# Patient Record
Sex: Female | Born: 1945
Health system: Southern US, Community
[De-identification: ages and names within clinical notes are randomized; demographics above are authoritative.]

## PROBLEM LIST (undated history)

## (undated) DIAGNOSIS — Z720 Tobacco use: Principal | ICD-10-CM

## (undated) DIAGNOSIS — B019 Varicella without complication: Secondary | ICD-10-CM

## (undated) DIAGNOSIS — E785 Hyperlipidemia, unspecified: Secondary | ICD-10-CM

## (undated) DIAGNOSIS — I10 Essential (primary) hypertension: Secondary | ICD-10-CM

## (undated) DIAGNOSIS — J40 Bronchitis, not specified as acute or chronic: Secondary | ICD-10-CM

## (undated) DIAGNOSIS — T7840XA Allergy, unspecified, initial encounter: Secondary | ICD-10-CM

## (undated) DIAGNOSIS — Z0000 Encounter for general adult medical examination without abnormal findings: Secondary | ICD-10-CM

## (undated) HISTORY — DX: Hyperlipidemia, unspecified: E78.5

## (undated) HISTORY — DX: Tobacco use: Z72.0

## (undated) HISTORY — DX: Allergy, unspecified, initial encounter: T78.40XA

## (undated) HISTORY — DX: Essential (primary) hypertension: I10

## (undated) HISTORY — PX: TONSILLECTOMY AND ADENOIDECTOMY: SHX28

## (undated) HISTORY — DX: Encounter for general adult medical examination without abnormal findings: Z00.00

## (undated) HISTORY — DX: Varicella without complication: B01.9

## (undated) HISTORY — DX: Bronchitis, not specified as acute or chronic: J40

## (undated) HISTORY — PX: TUBAL LIGATION: SHX77

---
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
## 1988-12-18 HISTORY — PX: OTHER SURGICAL HISTORY: SHX169

## 1995-04-20 HISTORY — PX: BREAST SURGERY: SHX581

## 2004-11-03 ENCOUNTER — Other Ambulatory Visit: Admission: RE | Admit: 2004-11-03 | Discharge: 2004-11-03 | Payer: Self-pay | Admitting: Family Medicine

## 2005-11-29 ENCOUNTER — Other Ambulatory Visit: Admission: RE | Admit: 2005-11-29 | Discharge: 2005-11-29 | Payer: Self-pay | Admitting: Family Medicine

## 2006-12-05 ENCOUNTER — Other Ambulatory Visit: Admission: RE | Admit: 2006-12-05 | Discharge: 2006-12-05 | Payer: Self-pay | Admitting: Family Medicine

## 2008-04-17 LAB — CONVERTED CEMR LAB

## 2008-04-19 HISTORY — PX: OTHER SURGICAL HISTORY: SHX169

## 2008-11-17 LAB — CONVERTED CEMR LAB

## 2008-12-06 LAB — HM MAMMOGRAPHY

## 2008-12-11 ENCOUNTER — Other Ambulatory Visit: Admission: RE | Admit: 2008-12-11 | Discharge: 2008-12-11 | Payer: Self-pay | Admitting: Family Medicine

## 2009-01-14 ENCOUNTER — Encounter: Payer: Self-pay | Admitting: Family Medicine

## 2009-12-10 ENCOUNTER — Ambulatory Visit: Payer: Self-pay | Admitting: Family Medicine

## 2009-12-10 DIAGNOSIS — Q054 Unspecified spina bifida with hydrocephalus: Secondary | ICD-10-CM | POA: Insufficient documentation

## 2009-12-10 DIAGNOSIS — M25569 Pain in unspecified knee: Secondary | ICD-10-CM | POA: Insufficient documentation

## 2009-12-10 DIAGNOSIS — N189 Chronic kidney disease, unspecified: Secondary | ICD-10-CM

## 2009-12-10 DIAGNOSIS — E663 Overweight: Secondary | ICD-10-CM | POA: Insufficient documentation

## 2009-12-10 DIAGNOSIS — D649 Anemia, unspecified: Secondary | ICD-10-CM

## 2009-12-10 DIAGNOSIS — M542 Cervicalgia: Secondary | ICD-10-CM

## 2009-12-10 DIAGNOSIS — E782 Mixed hyperlipidemia: Secondary | ICD-10-CM | POA: Insufficient documentation

## 2009-12-10 DIAGNOSIS — I10 Essential (primary) hypertension: Secondary | ICD-10-CM

## 2009-12-10 DIAGNOSIS — B079 Viral wart, unspecified: Secondary | ICD-10-CM | POA: Insufficient documentation

## 2009-12-10 LAB — CONVERTED CEMR LAB
ALT: 19 units/L (ref 0–35)
AST: 21 units/L (ref 0–37)
Albumin: 4.2 g/dL (ref 3.5–5.2)
Alkaline Phosphatase: 74 units/L (ref 39–117)
BUN: 27 mg/dL — ABNORMAL HIGH (ref 6–23)
Bilirubin, Direct: 0.1 mg/dL (ref 0.0–0.3)
Cholesterol: 196 mg/dL (ref 0–200)
Creatinine, Ser: 1.2 mg/dL (ref 0.4–1.2)
GFR calc non Af Amer: 47.35 mL/min (ref >60)
Total Protein: 6.5 g/dL (ref 6.0–8.3)
Triglycerides: 93 mg/dL (ref 0.0–149.0)

## 2010-01-05 ENCOUNTER — Ambulatory Visit: Payer: Self-pay | Admitting: Family Medicine

## 2010-01-05 LAB — CONVERTED CEMR LAB
Basophils Absolute: 0 10*3/uL (ref 0.0–0.1)
Basophils Relative: 0.4 % (ref 0.0–3.0)
Eosinophils Absolute: 0.1 10*3/uL (ref 0.0–0.7)
MCHC: 34.4 g/dL (ref 30.0–36.0)
MCV: 87.9 fL (ref 78.0–100.0)
Monocytes Absolute: 0.3 10*3/uL (ref 0.1–1.0)
Neutrophils Relative %: 73.6 % (ref 43.0–77.0)
Platelets: 221 10*3/uL (ref 150.0–400.0)
RDW: 13 % (ref 11.5–14.6)

## 2010-01-12 ENCOUNTER — Other Ambulatory Visit: Admission: RE | Admit: 2010-01-12 | Discharge: 2010-01-12 | Payer: Self-pay | Admitting: Family Medicine

## 2010-01-12 ENCOUNTER — Ambulatory Visit: Payer: Self-pay | Admitting: Family Medicine

## 2010-01-12 LAB — HM PAP SMEAR

## 2010-01-14 ENCOUNTER — Encounter: Payer: Self-pay | Admitting: Family Medicine

## 2010-02-04 ENCOUNTER — Encounter: Payer: Self-pay | Admitting: Family Medicine

## 2010-05-13 ENCOUNTER — Telehealth: Payer: Self-pay | Admitting: Family Medicine

## 2010-05-19 NOTE — Letter (Signed)
Summary: Generic Letter  [facility]   [place], [place] [address]   Phone: [phone]  Fax: [phone]    01/05/2010  [address] [place], [place]  [address]  Dear Ms. Kroft,    Our office wanted to let you know that everything was normal with your labs. I have also included a copy of the labs for your records.  Any questions please don't hesitate to contact us at [phone].       Sincerely,   Gardenia Phlegm RMA

## 2010-05-19 NOTE — Letter (Signed)
Summary: Records from [facility]  Records from [facility] 2008-2010   Imported By: Laural Benes 12/19/2009 11:03:37  _____________________________________________________________________  External Attachment:    Type:   Image     Comment:   External Document

## 2010-05-19 NOTE — Letter (Signed)
Summary: Generic Letter  [facility]   [place], [place] [address]   Phone: [phone]  Fax: [phone]     01/14/2010  [place], [place]  [address]   Dear Ms. Harnack,   Our office wanted to inform you that your pap came back normal.   We don't have a number listed for you so if you could contact us with a phone number to update your chart it would be greatly appreciated.  If you have any questions please contact our office at [phone].          Sincerely,   Gardenia Phlegm RMA
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
## 2010-05-19 NOTE — Assessment & Plan Note (Signed)
Summary: follow up with pap/cb   Vital Signs:  Patient profile:   65 year old female Height:      60.5 inches (153.67 cm) Weight:      175 pounds (79.55 kg) O2 Sat:      98 % on Room air Temp:     97.6 degrees F (36.44 degrees C) oral Pulse rate:   96 / minute BP sitting:   112 / 84  (left arm) Cuff size:   regular  Vitals Entered By: Gardenia Phlegm RMA (January 12, 2010 9:21 AM)  O2 Flow:  Room air  Serial Vital Signs/Assessments:  Time      Position  BP       Pulse  Resp  Temp     By                     128/90                         Penni Homans MD  CC: follow-up visit w/ pap/ CF Is Patient Diabetic? No Menarche (age onset years): 11   Menses interval (days): every 21 to 35 days Menstrual flow (days): 8 days Last PAP Result historical   History of Present Illness: Patient in today for annual pap, generally doing well. Is due for her annual MGM and obtains them at [facility]. No breast or GYN c/o today. No discharge/lesions/pain/f/c/abd or back discomfort. No dysuria/CP/palp/SOB/GI concerns. She is noting an irritated bump in her right nares over the past week. No nasal congestion or epistaxis  Current Medications (verified): 1)  Voltaren 1 % Gel (Diclofenac Sodium) .... As Needed 2)  Tramadol Hcl 50 Mg Tabs (Tramadol Hcl) .... Take 1-2 Tab As Needed For Pain. Maximum of 8 Tabs Per Day 3)  Maxzide-25 37.5-25 Mg Tabs (Triamterene-Hctz) .Marland Kitchen.. 1 Tab Each Morning  Allergies (verified): No Known Drug Allergies  Past History:  Past medical history reviewed for relevance to current acute and chronic problems. Social history (including risk factors) reviewed for relevance to current acute and chronic problems.  Social History: Reviewed history from 12/10/2009 and no changes required. Occupation: Pharmacist, hospital at [employer] Current Smoker: max of a 1/4 ppd Married Alcohol use-yes, occasional Drug use-no Regular exercise-yes, intermittently Avoids red meat uses  seat belts  Review of Systems      See HPI  Physical Exam  General:  Well-developed,well-nourished,in no acute distress; alert,appropriate and cooperative throughout examination Head:  Normocephalic and atraumatic without obvious abnormalities. No apparent alopecia or balding. Ears:  External ear exam shows no significant lesions or deformities.  Otoscopic examination reveals clear canals, tympanic membranes are intact bilaterally without bulging, retraction, inflammation or discharge. Hearing is grossly normal bilaterally. Nose:  External nasal examination shows no deformity or inflammation. Nasal mucosa are pink and moist  exudates. Small white head noted in right nares on septum Mouth:  Oral mucosa and oropharynx without lesions or exudates.  Teeth in good repair. Neck:  No deformities, masses, or tenderness noted. Chest Wall:  No deformities, masses, or tenderness noted. Breasts:  No mass, nodules, thickening, tenderness, bulging, retraction, inflamation, nipple discharge or skin changes noted.   Lungs:  Normal respiratory effort, chest expands symmetrically. Lungs are clear to auscultation, no crackles or wheezes. Heart:  Normal rate and regular rhythm. S1 and S2 normal without gallop, murmur, click, rub or other extra sounds. Abdomen:  Bowel sounds positive,abdomen soft and non-tender without masses, organomegaly or hernias  noted. Genitalia:  Normal introitus for age, no external lesions, thin/white vaginal discharge, mucosa pink and moist, no vaginal or cervical lesions, no vaginal atrophy, no friaility or hemorrhage, normal uterus size and position, no adnexal masses or tenderness Msk:  No deformity or scoliosis noted of thoracic or lumbar spine.   Extremities:  No clubbing, cyanosis, edema, or deformity noted  Neurologic:  No cranial nerve deficits noted. Station and gait are normal. Plantar reflexes are down-going bilaterally. DTRs are symmetrical throughout. Sensory, motor and  coordinative functions appear intact. Cervical Nodes:  No lymphadenopathy noted Psych:  Cognition and judgment appear intact. Alert and cooperative with normal attention span and concentration. No apparent delusions, illusions, hallucinations   Impression & Recommendations:  Problem # 1:  Preventive Health Care (ICD-V70.0) Pap taken today and MGM ordered, reviewed report on last colonosocpy done in July of 2009 with patient, this showed some Adenometous polyps and they recommended repeat colonoscopy in 3 years which would be July of 2012, patient aware. Presently denies any bowel concerns. No bloody or tarry stool, bowels move most days without difficulty  Problem # 2:  ANEMIA (ICD-285.9) Resolved, cont vit B complex  Problem # 3:  MIXED HYPERLIPIDEMIA (ICD-272.2)  The following medications were removed from the medication list:    Simvastatin 80 Mg Tabs (Simvastatin) .Marland Kitchen... 1 tablet by mouth in evening Her updated medication list for this problem includes:    Simvastatin 40 Mg Tabs (Simvastatin) .Marland Kitchen... 1 tab by mouth at bedtime Encouraged 2 fish oil caps daily  Problem # 4:  ESSENTIAL HYPERTENSION, BENIGN (ICD-401.1)  Her updated medication list for this problem includes:    Maxzide-25 37.5-25 Mg Tabs (Triamterene-hctz) .Marland Kitchen... 1 tab each morning Well controlled no changes today  Complete Medication List: 1)  Voltaren 1 % Gel (Diclofenac sodium) .... As needed 2)  Tramadol Hcl 50 Mg Tabs (Tramadol hcl) .... Take 1-2 tab as needed for pain. maximum of 8 tabs per day 3)  Maxzide-25 37.5-25 Mg Tabs (Triamterene-hctz) .Marland Kitchen.. 1 tab each morning 4)  Simvastatin 40 Mg Tabs (Simvastatin) .Marland Kitchen.. 1 tab by mouth at bedtime 5)  Bactroban 2 % Oint (Mupirocin) .... Apply to right nares two times a day x 7days and as needed lesion two times a day after that  Other Orders: Tdap => 58yrs IM [id number]) Admin 1st Vaccine [id number])  Patient Instructions: 1)  Please schedule a follow-up appointment in 1 year.    2)  all labs prior to annual exam, v70.0 3)  BMP prior to visit, ICD-9:  4)  Hepatic Panel prior to visit ICD-9:  5)  Lipid panel prior to visit ICD-9 :  6)  TSH prior to visit ICD-9 :  7)  CBC w/ Diff prior to visit ICD-9 :  8)  in 3 months needs FLP, renal, lft for 272.4 and 401.1 9)  Avoid trans fats 10)  Take 2 fish or 2 flaxseed oil caps daily, ca/vit d daily and increase exercise Prescriptions: BACTROBAN 2 % OINT (MUPIROCIN) apply to right nares two times a day x 7days and as needed lesion two times a day after that  #1 tube x 1   Entered and Authorized by:   Penni Homans MD   Signed by:   Penni Homans MD on 01/12/2010   Method used:   Electronically to        [facility]  [phone]* (retail)       [address]  [place], [place]  [address]       Ph: [phone] or [phone]       Fax: [phone]   RxID:   [id number] SIMVASTATIN 40 MG TABS (SIMVASTATIN) 1 tab by mouth at bedtime  #30 x 5   Entered and Authorized by:   Penni Homans MD   Signed by:   Penni Homans MD on 01/12/2010   Method used:   Electronically to        [facility]  [phone]* (retail)       [address]       [place], [place]  [address]       Ph: [phone] or [phone]       Fax: [phone]   RxID:   [id number] VOLTAREN 1 % GEL (DICLOFENAC SODIUM) as needed  #1 tube x 0   Entered by:   Gardenia Phlegm RMA   Authorized by:   Penni Homans MD   Signed by:   Gardenia Phlegm RMA on 01/12/2010   Method used:   Electronically to        [facility]  [phone]* (retail)       [address]       [place],   [address]       Ph: [phone] or [phone]       Fax: [phone]   [id number]     Immunizations Administered:  Tetanus Vaccine:    Vaccine Type: Tdap    Site: left deltoid    Mfr: GlaxoSmithKline    Dose: 0.5 ml    Route: IM    Given by:  Gardenia Phlegm RMA    Exp. Date: 02/06/2012    Lot #: [id number]    VIS given: 03/06/08 version given January 12, 2010.

## 2010-05-21 NOTE — Progress Notes (Signed)
Summary: Maxzide Refill  Phone Note Refill Request Call back at [phone](work) Message from:  Patient on May 13, 2010 11:17 AM  Refills Requested: Medication #1:  [id number] 37.5-25 MG TABS 1 tab each morning   Dosage confirmed as above?Dosage Confirmed   Brand Name Necessary? No   Supply Requested: 6 months Pt states needs refill, cannot schedule OV til her insurance is active which will be the end of Feb 2012   Method Requested: Electronic Next Appointment Scheduled: none Initial call taken by: Titus Dubin,  May 13, 2010 11:18 AM    Prescriptions: Bradd Burner 37.5-25 MG TABS (TRIAMTERENE-HCTZ) 1 tab each morning  #30 x 2   Entered by:   Gardenia Phlegm RMA   Authorized by:   Penni Homans MD   Signed by:   Gardenia Phlegm RMA on 05/13/2010   Method used:   Electronically to        [facility]  [phone]* (retail)       [address]       [place], [place]  [address]       Ph: [phone] or [phone]       Fax: [phone]   RxID:   [id number]

## 2010-07-31 ENCOUNTER — Encounter: Payer: Self-pay | Admitting: Family Medicine

## 2010-08-05 ENCOUNTER — Ambulatory Visit: Payer: Self-pay | Admitting: Family Medicine

## 2010-08-12 ENCOUNTER — Other Ambulatory Visit: Payer: Self-pay | Admitting: Family Medicine

## 2010-08-12 MED ORDER — TRIAMTERENE-HCTZ 37.5-25 MG PO TABS: 1.0000 | ORAL_TABLET | Freq: Every day | ORAL | Status: DC

## 2010-08-12 MED ORDER — LOVASTATIN 40 MG PO TABS: 40.0000 mg | ORAL_TABLET | Freq: Every day | ORAL | Status: DC

## 2010-08-12 NOTE — Telephone Encounter (Signed)
Patient needs medication refill but cannot afford to schedule an OV, please contact patient

## 2010-08-12 NOTE — Telephone Encounter (Signed)
Per pt she would like her hctz called in to [facility] on [address] and something different than Simvastatin because it makes her "bones ache".  Per md can have #30 X 2 of the hctz and Lovastatin 40 mg. Pt must make a lab appt and appt with MD in 3 months or no other meds will be dispersed. Pt states she will make a lab and MD appt in 3 months.

## 2010-11-20 ENCOUNTER — Telehealth: Payer: Self-pay | Admitting: Family Medicine

## 2010-11-20 NOTE — Telephone Encounter (Signed)
Patient needs bp med refill but cannot afford an OV until Sept, please contact patient

## 2010-11-23 MED ORDER — TRIAMTERENE-HCTZ 37.5-25 MG PO TABS: 1.0000 | ORAL_TABLET | Freq: Every day | ORAL | Status: DC

## 2011-01-14 ENCOUNTER — Ambulatory Visit (INDEPENDENT_AMBULATORY_CARE_PROVIDER_SITE_OTHER): Payer: PRIVATE HEALTH INSURANCE | Admitting: Family Medicine

## 2011-01-14 ENCOUNTER — Encounter: Payer: Self-pay | Admitting: Family Medicine

## 2011-01-14 DIAGNOSIS — B019 Varicella without complication: Secondary | ICD-10-CM | POA: Insufficient documentation

## 2011-01-14 DIAGNOSIS — Z0000 Encounter for general adult medical examination without abnormal findings: Secondary | ICD-10-CM

## 2011-01-14 DIAGNOSIS — M549 Dorsalgia, unspecified: Secondary | ICD-10-CM

## 2011-01-14 DIAGNOSIS — I10 Essential (primary) hypertension: Secondary | ICD-10-CM | POA: Insufficient documentation

## 2011-01-14 DIAGNOSIS — E785 Hyperlipidemia, unspecified: Secondary | ICD-10-CM | POA: Insufficient documentation

## 2011-01-14 DIAGNOSIS — T7840XA Allergy, unspecified, initial encounter: Secondary | ICD-10-CM

## 2011-01-14 DIAGNOSIS — B059 Measles without complication: Secondary | ICD-10-CM | POA: Insufficient documentation

## 2011-01-14 DIAGNOSIS — M542 Cervicalgia: Secondary | ICD-10-CM

## 2011-01-14 DIAGNOSIS — N189 Chronic kidney disease, unspecified: Secondary | ICD-10-CM

## 2011-01-14 DIAGNOSIS — E782 Mixed hyperlipidemia: Secondary | ICD-10-CM

## 2011-01-14 HISTORY — DX: Encounter for general adult medical examination without abnormal findings: Z00.00

## 2011-01-14 HISTORY — DX: Allergy, unspecified, initial encounter: T78.40XA

## 2011-01-14 MED ORDER — TRAMADOL HCL 50 MG PO TABS: ORAL_TABLET | ORAL | Status: DC

## 2011-01-14 MED ORDER — FLUTICASONE PROPIONATE 50 MCG/ACT NA SUSP: 1.0000 | Freq: Every day | NASAL | Status: AC | PRN

## 2011-01-14 MED ORDER — CYCLOBENZAPRINE HCL 10 MG PO TABS: 10.0000 mg | ORAL_TABLET | Freq: Two times a day (BID) | ORAL | Status: DC | PRN

## 2011-01-14 MED ORDER — TRIAMTERENE-HCTZ 37.5-25 MG PO TABS: 1.0000 | ORAL_TABLET | Freq: Every day | ORAL | Status: DC

## 2011-01-14 NOTE — Assessment & Plan Note (Signed)
Patient is not taking her Lovastatin due to muscle cramps, will check lipid panel and then for proceed with medications as indicated. At present she is advised to

## 2011-01-14 NOTE — Patient Instructions (Signed)
Preventative Care for Adults - Female Studies show that half of deaths in the [place] today result from unhealthy lifestyle practices. This includes ignoring preventive care suggestions. Preventive health guidelines for women include the following key practices:  A routine yearly physical is a good way to check with your primary caregiver about your health and preventive screening. It is a chance to share any concerns and updates on your health, and to receive a thorough all-over exam.   If you smoke cigarettes, find out from your caregiver how to quit. It can literally save your life, no matter how long you have been a tobacco user. If you do not use tobacco, never start.   Maintain a healthy diet and normal weight. Increased weight leads to problems with blood pressure and diabetes. Decrease saturated fat in your diet and increase regular exercise. Eat a variety of foods, including fruit, vegetables, animal or vegetable protein (meat, fish, chicken, and eggs, or beans, lentils, and tofu), and grains, such as rice. Get information about proper diet from your caregiver, if needed.   Aerobic exercise helps maintain good heart health. The CDC and the American College of Sports Medicine recommend 30 minutes of moderate-intensity exercise (a brisk walk that increases your heart rate and breathing) on most days of the week. Ongoing high blood pressure should be treated with medicines, if weight loss and exercise are not effective.   Avoid smoking, drinking too much alcohol (more than two drinks per day), and use of street drugs. Do not share needles with anyone. Ask for professional help if you need support or instructions about stopping the use of alcohol, cigarettes, or drugs.   Maintain normal blood lipids and cholesterol, by minimizing your intake of saturated fat. Eat a well rounded diet, with plenty of fruit and vegetables. The National Institutes of Health encourage women to eat 5-9 servings of  fruit and vegetables each day. Your caregiver can give instructions to help you keep your risk of heart disease or stroke low. High blood pressure causes heart disease and increases risk of stroke. Blood pressure should be checked every 1-2 years, from age 20 onward.   Blood tests for high cholesterol, which causes heart and vessel disease, should begin at age 20 and be repeated every 5 years, if test results are normal. (Repeat tests more often if results are high.)   Diabetes screening involves taking a blood sample to check your blood sugar level, after a fasting period. This is done once every 3 years, after age 45, if test results are normal.   Breast cancer screening is essential to preventive care for women. All women age 20 and older should perform a breast self-exam every month. At age 40 and older, women should have their caregiver complete a breast exam each year. Women at ages 40-50 should have a mammogram (x-ray film) of the breasts each year. Your caregiver can discuss when to start your yearly mammograms.   Cervical cancer screening includes taking a Pap smear (sample of cells examined under a microscope) from the cervix (end of the uterus). It also includes testing for HPV (Human Papilloma Virus, which can cause cervical cancer). Screening and a pelvic exam should begin at age 21, or 3 years after a woman becomes sexually active. Screening should occur every year, with a Pap smear but no HPV testing, up to age 30. After age 30, you should have a Pap smear every 3 years with HPV testing, if no HPV was found previously.     Colon cancer can be detected, and often prevented, long before it is life threatening. Most routine colon cancer screening begins at the age of 50. On a yearly basis, your caregiver may provide easy-to-use take-home tests to check for hidden blood in the stool. Use of a small camera at the end of a tube, to directly examine the colon (sigmoidoscopy or colonoscopy), can  detect the earliest forms of colon cancer and can be life saving. Talk to your caregiver about this at age 50, when routine screening begins. (Screening is repeated every 5 years, unless early forms of pre-cancerous polyps or small growths are found.)   Practice safe sex. Use condoms. Condoms are used for birth control and to reduce the spread of sexually transmitted infections (STIs). Unsafe sex is sexual activity without the use of safeguards, such as condoms and avoidance of high-risk acts, to reduce the chances of getting or spreading STIs. STIs include gonorrhea (the clap), chlamydia, syphilis, trichimonas, herpes, HPV (human papilloma virus) and HIV (human immunodeficiency virus), which causes AIDS. Herpes, HIV, and HPV are viral illnesses that have no cure. They can result in disability, cancer, and death.   HPV causes cancer of the cervix, and other infections that can be transmitted from person to person. There is a vaccine for HPV, and females should get immunized between the ages of 11 and 26. It requires a series of 3 shots.   Osteoporosis is a disease in which the bones lose minerals and strength as we age. This can result in serious bone fractures. Risk of osteoporosis can be identified using a bone density scan. Women ages 65 and over should discuss this with their caregivers, as should women after menopause who have other risk factors. Ask your caregiver whether you should be taking a calcium supplement and Vitamin D, to reduce the rate of osteoporosis.   Menopause can be associated with physical symptoms and risks. Hormone replacement therapy is available to decrease these. You should talk to your caregiver about whether starting or continuing to take hormones is right for you.   Use sunscreen with SPF (skin protection factor) of 15 or more. Apply sunscreen liberally and repeatedly throughout the day. Being outside in the sun, when your shadow is shorter than you are, means you are being  exposed to sun at greater intensity. Lighter skinned people are at a greater risk of skin cancer. Wear sunglasses, to protect your eyes from too much damaging sunlight (which can speed up cataract formation).   Once a month, do a whole body skin exam or review, using a mirror to look at your back. Notify your caregiver of changes in moles, especially if there are changes in shapes, colors, irregular border, a size larger than a pencil eraser, or new moles develop.   Keep carbon monoxide and smoke detectors in your home, and functioning, at all times. Change the batteries every 6 months, or use a model that plugs into the wall.   Stay up to date with your tetanus shots and other required immunizations. A booster for tetanus should be given every 10 years. Be sure to get your flu shot every year, since 5%-20% of the [place] population comes down with the flu. The composition of the flu vaccine changes each year, so being vaccinated once is not enough. Get your shot in the fall, before the flu season peaks. The table below lists important vaccines to get. Other vaccines to consider include for Hepatitis A virus (to prevent a form of   infection of the liver, by a virus acquired from food), Varicella Zoster (a virus that causes shingles), and Meninogoccal (against bacteria which cause a form of meningitis).   Brush your teeth twice a day with fluoride toothpaste, and floss once a day. Good oral hygiene prevents tooth decay and gum disease, which can be painful and can cause other health problems. Visit your dentist for a routine oral and dental check up and preventive care every 6-12 months.   The Body Mass Index or BMI is a way of measuring how much of your body is fat. Having a BMI above 27 increases the risk of heart disease, diabetes, hypertension, stroke and other problems related to obesity. Your caregiver can help determine your BMI, and can develop an exercise and dietary program to help you achieve or  maintain this measurement at a healthy level.   Wear seat belts whenever you are in a vehicle, whether as passenger or driver, and even for short drives of a few minutes.   If you bicycle, wear a helmet at all times.  Preventative Care for Adult Women  Preventative Services Ages 65-39 Ages 60-64 Ages 27 and over  Health risk assessment and lifestyle counseling.     Blood pressure check.** Every 1-2 years Every 1-2 years Every 1-2 years  Total cholesterol check including HDL.** Every 5 years beginning at age 39 Every 44 years beginning at age 84, or more often if risk is high Every 5 years through age 13, then optional  Breast self exam. Monthly in all women ages 79 and older Monthly Monthly  Clinical breast exam.** Every 3 years beginning at age 56 Every year Every year  Mammogram.**  Every year beginning at age 52, optional from age 66-49 (discuss with your caregiver). Every year until age 55, then optional  Pap Smear** and HPV Screening. Every year from ages 51 through 70 Every 46 years from ages 30 through 70, if HPV is negative Optional; talk with your caregiver  Flexible sigmoidoscopy** or colonoscopy.**   Every 5 years beginning at age 77 Every 41 years until age 9; then optional  FOBT (fecal occult blood test) of stool.  Every year beginning at age 89 Every year until 53; then optional  Skin self-exam. Monthly Monthly Monthly  Tetanus-diphtheria (Td) immunization. Every 10 years Every 10 years Every 10 years  Influenza immunization.** Every year Every year Every year  HPV immunization. Once between the ages of 22 and 27     Pneumococcal immunization.** Optional Optional Every 5 years  Hepatitis B immunization.** Series of 3 immunizations  (if not done previously, usually given at 0, 1 to 2, and 4 to 6 months)  Check with your caregiver, if vaccination not previously given Check with your caregiver, if vaccination not previously given  ** Family history and personal history of risk and  conditions may change your caregiver's recommendations.  Document Released: 06/01/2001 Document Re-Released: 06/30/2009 [facility] Patient Information 2011 [place].  Use Cetaphil Soap, cleanse with [employer]	[place] Astringent and use a Salicylic Acid product on any lesions, call for possible antibiotic if continues to worsen  On a bad allergy day can take a second dose of Zyrtec  Omnaris nasal spray, 1 spay each nostril prn allergies

## 2011-01-14 NOTE — Assessment & Plan Note (Signed)
Patient encouraged to alternate heat and ice, try Aspercreme prn and is given some Cyclobenzaprine to use prn in addition to her previous meds

## 2011-01-14 NOTE — Assessment & Plan Note (Signed)
Takes Cetirizine 10 mg daily is given a sample of Omnaris to try 1 spray each nostil and/or she can try Fluticasone 1 spray each nostril once she completes Omnaris course, use as needed

## 2011-01-14 NOTE — Assessment & Plan Note (Signed)
Adequately controlled, Refills given for Maxzide today

## 2011-01-14 NOTE — Assessment & Plan Note (Signed)
Patient declines flu, pelvic exam unremarkable, patient without history of abnormal paps, may move to a pap every 2-3 years. Patient due for mgm later this fall. She will proceed with [facility]

## 2011-01-14 NOTE — Assessment & Plan Note (Signed)
Will check a CMP today 

## 2011-01-14 NOTE — Progress Notes (Signed)
Morgan Fitzgerald [id number] 07/10/1945 01/14/2011      Progress Note New Patient  Subjective  Chief Complaint  Chief Complaint  Patient presents with  . Annual Exam    physical   . Gynecologic Exam    pap    HPI  Patient is a 65 year old Caucasian female in today for annual exam. She did have a Pap smear last year which was unremarkable. She denies any history of abnormal. She began. At 65 years of age and stopped them abruptly at 3. No gyn concerns, no discharge or pain. No illness, fevers, chills, chest pain, palpitations, shortness of breath, GI or GU concerns. She does continue to struggle with chronic pain in her right foot despite surgical correction of the bunion and some other malformations in her foot. 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 She also chronic neck pain and uses topical treatment including ice and wheelchair and to help manage the pain. She finds intolerable but does have trouble sleeping at night due to her neck pain at times.  Past Medical History  Diagnosis Date  . Chicken pox as a child  . Measles as a child  . Hyperlipidemia   . Hypertension   . Preventative health care 01/14/2011  . Allergic state 01/14/2011    Past Surgical History  Procedure Date  . Total knee replacement, right   . Tonsillectomy and adenoidectomy   . Breast surgery 1997    breast biopsy, right  . Right shoulder repai 1966    s/p fracture that healed poorly  . Carpal tunnel release, b/l 1990's  . Tubal ligation   . Right foot bunionectomy 2010    4 pins in lateral right foot    Family History  Problem Relation Age of Onset  . Hypertension Mother   . Hyperlipidemia Mother   . Coronary artery disease Mother   . Cancer Mother     breast  . Stroke Father   . Heart disease Father   . Hyperlipidemia Father   . Coronary artery disease Father     s/p MI  . Diabetes Father   . Hypertension Father   . Hypertension Sister    . Hyperlipidemia Sister   . Obesity Sister   . Coronary artery disease Maternal Grandmother   . Heart attack Maternal Grandfather   . Diabetes Paternal Grandmother   . Hypertension Paternal Grandmother   . Hyperlipidemia Paternal Grandmother   . Obesity Paternal Grandmother   . Stroke Paternal Grandfather     History   Social History  . Marital Status: Married    Spouse Name: N/A    Number of Children: N/A  . Years of Education: N/A   Occupational History  . Not on file.   Social History Main Topics  . Smoking status: Current Everyday Smoker -- 0.2 packs/day    Types: Cigarettes  . Smokeless tobacco: Never Used  . Alcohol Use: Yes     occasional  . Drug Use: No  . Sexually Active: Not on file   Other Topics Concern  . Not on file   Social History Narrative  . No narrative on file    Current Outpatient Prescriptions on File Prior to Visit  Medication Sig Dispense Refill  . diclofenac sodium (VOLTAREN) 1 % GEL Apply topically as needed.        . lovastatin (MEVACOR) 40 MG tablet Take 1 tablet (40 mg total) by mouth at  bedtime.  30 tablet  2  . mupirocin (BACTROBAN) 2 % ointment Apply topically 2 (two) times daily. X 7 days prn lesion bid after that         No Known Allergies  Review of Systems  Review of Systems  Constitutional: Negative for fever, chills and malaise/fatigue.  HENT: Positive for neck pain. Negative for hearing loss, nosebleeds and congestion.   Eyes: Negative for discharge.  Respiratory: Negative for cough, sputum production, shortness of breath and wheezing.   Cardiovascular: Negative for chest pain, palpitations and leg swelling.  Gastrointestinal: Negative for heartburn, nausea, vomiting, abdominal pain, diarrhea, constipation and blood in stool.  Genitourinary: Negative for dysuria, urgency, frequency and hematuria.  Musculoskeletal: Positive for back pain and joint pain. Negative for myalgias and falls.       Chronic right foot pain and  low back/neck pain persists  Skin: Negative for rash.  Neurological: Negative for dizziness, tremors, sensory change, focal weakness, loss of consciousness, weakness and headaches.  Endo/Heme/Allergies: Negative for polydipsia. Does not bruise/bleed easily.  Psychiatric/Behavioral: Negative for depression and suicidal ideas. The patient is not nervous/anxious and does not have insomnia.     Objective  BP 128/78  Pulse 88  Temp(Src) 97.7 F (36.5 C) (Oral)  Ht 5\' 5"  (1.651 m)  Wt 170 lb 6.4 oz (77.293 kg)  BMI 28.36 kg/m2  SpO2 97%  Physical Exam  Physical Exam  Constitutional: She is oriented to person, place, and time and well-developed, well-nourished, and in no distress. No distress.  HENT:  Head: Normocephalic and atraumatic.  Right Ear: External ear normal.  Left Ear: External ear normal.  Nose: Nose normal.  Mouth/Throat: Oropharynx is clear and moist. No oropharyngeal exudate.  Eyes: Conjunctivae are normal. Pupils are equal, round, and reactive to light. Right eye exhibits no discharge. Left eye exhibits no discharge. No scleral icterus.  Neck: Normal range of motion. Neck supple. No thyromegaly present.  Cardiovascular: Normal rate, regular rhythm, normal heart sounds and intact distal pulses.   No murmur heard. Pulmonary/Chest: Effort normal and breath sounds normal. No respiratory distress. She has no wheezes. She has no rales.  Abdominal: Soft. Bowel sounds are normal. She exhibits no distension and no mass. There is no tenderness.  Genitourinary: Vagina normal, uterus normal, cervix normal, right adnexa normal and left adnexa normal. No vaginal discharge found.       Breast exam neg for any masses, skin changes or discharge b/l  Musculoskeletal: Normal range of motion. She exhibits no edema and no tenderness.  Lymphadenopathy:    She has no cervical adenopathy.  Neurological: She is alert and oriented to person, place, and time. She has normal reflexes. No cranial  nerve deficit. Coordination normal.  Skin: Skin is warm and dry. No rash noted. She is not diaphoretic.  Psychiatric: Mood, memory and affect normal.       Assessment & Plan  ESSENTIAL HYPERTENSION, BENIGN Adequately controlled, Refills given for Maxzide today  MIXED HYPERLIPIDEMIA Patient is not taking her Lovastatin due to muscle cramps, will check lipid panel and then for proceed with medications as indicated. At present she is advised to   Preventative health care Patient declines flu, pelvic exam unremarkable, patient without history of abnormal paps, may move to a pap every 2-3 years. Patient due for mgm later this fall. She will proceed with [facility]  RENAL INSUFFICIENCY, CHRONIC Will check a CMP today  NECK PAIN, CHRONIC Patient encouraged to alternate heat and ice, try Aspercreme prn  and is given some Cyclobenzaprine to use prn in addition to her previous meds  Allergic state Takes Cetirizine 10 mg daily is given a sample of Omnaris to try 1 spray each nostil and/or she can try Fluticasone 1 spray each nostril once she completes Omnaris course, use as needed

## 2011-01-15 LAB — LDL CHOLESTEROL, DIRECT: Direct LDL: 224.9 mg/dL

## 2011-01-15 LAB — COMPREHENSIVE METABOLIC PANEL
BUN: 29 mg/dL — ABNORMAL HIGH (ref 6–23)
CO2: 27 mEq/L (ref 19–32)
Creatinine, Ser: 1.1 mg/dL (ref 0.4–1.2)
GFR: 53.17 mL/min — ABNORMAL LOW (ref >60.00)
Glucose, Bld: 104 mg/dL — ABNORMAL HIGH (ref 70–99)
Total Bilirubin: 0.5 mg/dL (ref 0.3–1.2)

## 2011-01-15 LAB — LIPID PANEL
Cholesterol: 297 mg/dL — ABNORMAL HIGH (ref 0–200)
Total CHOL/HDL Ratio: 5
Triglycerides: 117 mg/dL (ref 0.0–149.0)

## 2011-01-20 MED ORDER — FENOFIBRATE 40 MG PO TABS: 1.0000 | ORAL_TABLET | Freq: Every day | ORAL | Status: DC

## 2011-01-20 NOTE — Progress Notes (Signed)
Addended by: Varney Daily on: 01/20/2011 08:47 AM   Modules accepted: Orders

## 2011-03-16 ENCOUNTER — Telehealth: Payer: Self-pay | Admitting: Family Medicine

## 2011-03-16 NOTE — Telephone Encounter (Signed)
Is her skin more acne or more red and flaky like Rosacea? I need more details about what it is like now to treat

## 2011-03-16 NOTE — Telephone Encounter (Signed)
Patient's face is getting worse, is there an Rx that can be sent in?

## 2011-03-17 NOTE — Telephone Encounter (Signed)
pts spouse states pt is working today. Pts spouse states he will have patient return our call.

## 2011-03-18 MED ORDER — CLINDAMYCIN PHOS-BENZOYL PEROX 1-5 % EX GEL: CUTANEOUS | Status: DC

## 2011-03-18 NOTE — Telephone Encounter (Signed)
Pt states it is acne.

## 2011-03-18 NOTE — Telephone Encounter (Signed)
Patient informed and RX sent to pharmacy 

## 2011-03-18 NOTE — Telephone Encounter (Signed)
OK have her use Cetaphil soap daily, Witch Hazel Astringent after that and then send in rx for Benzaclin gel apply to lesions whs, disp # 1 tube with 1 refill

## 2011-03-19 ENCOUNTER — Telehealth: Payer: Self-pay

## 2011-03-19 NOTE — Telephone Encounter (Signed)
Sure start with a good Benzoyl peroxide product over the counter, it is one of the 2 ingredients in the cream I called in would recommend 10 % strength, Oxy10 or clear and blue makes a good product. If no results or partial results can add or switch to a Salicylic Acid containing product.

## 2011-03-19 NOTE — Telephone Encounter (Signed)
Patient informed. 

## 2011-03-19 NOTE — Telephone Encounter (Signed)
Pt states the cream that was sent in for her face is $150 and she can't afford that? Is there anything else that can be called in or something else she could try over the counter? Please advise?

## 2011-03-24 ENCOUNTER — Ambulatory Visit (INDEPENDENT_AMBULATORY_CARE_PROVIDER_SITE_OTHER): Payer: PRIVATE HEALTH INSURANCE | Admitting: Family Medicine

## 2011-03-24 ENCOUNTER — Encounter: Payer: Self-pay | Admitting: Family Medicine

## 2011-03-24 DIAGNOSIS — J40 Bronchitis, not specified as acute or chronic: Secondary | ICD-10-CM

## 2011-03-24 DIAGNOSIS — Z720 Tobacco use: Secondary | ICD-10-CM

## 2011-03-24 DIAGNOSIS — I10 Essential (primary) hypertension: Secondary | ICD-10-CM

## 2011-03-24 DIAGNOSIS — F17200 Nicotine dependence, unspecified, uncomplicated: Secondary | ICD-10-CM

## 2011-03-24 HISTORY — DX: Tobacco use: Z72.0

## 2011-03-24 HISTORY — DX: Bronchitis, not specified as acute or chronic: J40

## 2011-03-24 MED ORDER — GUAIFENESIN ER 600 MG PO TB12: 1200.0000 mg | ORAL_TABLET | Freq: Two times a day (BID) | ORAL | Status: DC

## 2011-03-24 MED ORDER — AZITHROMYCIN 250 MG PO TABS: ORAL_TABLET | ORAL | Status: AC

## 2011-03-24 MED ORDER — HYDROCOD POLST-CHLORPHEN POLST 10-8 MG/5ML PO LQCR: 5.0000 mL | Freq: Two times a day (BID) | ORAL | Status: DC | PRN

## 2011-03-24 MED ORDER — METHYLPREDNISOLONE ACETATE PF 40 MG/ML IJ SUSP
40.0000 mg | Freq: Once | INTRAMUSCULAR | Status: AC
  Administered 2011-03-24: 40 mg via INTRAMUSCULAR

## 2011-03-24 NOTE — Assessment & Plan Note (Signed)
Mild elevation with acute illness, no changes recommended today.

## 2011-03-24 NOTE — Patient Instructions (Signed)

## 2011-03-24 NOTE — Assessment & Plan Note (Signed)
Wheezing, viral vs bacterial, given Depo Medrol 40 mg IM today and started on Azithromycin and Mucinex, given Tussionex to use prn. Sent home to increase fluids and rest, call if no improvement

## 2011-03-24 NOTE — Assessment & Plan Note (Signed)
Down to just 3 cigarettes daily and no cig in past 5 days. Encouraged complete cessation

## 2011-03-24 NOTE — Progress Notes (Signed)
Morgan Fitzgerald [id number] 07-Aug-1945 03/24/2011      Progress Note-Follow Up  Subjective  Chief Complaint  Chief Complaint  Patient presents with  . Influenza    possible flu  . Cough    X 4 days  . Nasal Congestion    X 4 days  . Muscle Pain    X 4 days  . Fatigue    X 4 days  . Fever    on Sunday and last night and this morning  . Nausea    vomitted once on Sunday    HPI  Patient is a 65 year old Caucasian female who is in today with 5 day history of worsening respiratory symptoms. She started with nausea and vomiting as well as fevers to 102. She had headache and congestion. Her vomiting has resolved but diarrhea has developed. She has significant nasal congestion productive of yellow sputum throat irritation and postnasal drip. She has headache, malaise, cough and shortness of breath. She is struggling with anorexia. She has several loose stool a day but no bloody or tarry stool. She reports myalgias but these are her baseline. She is not taking any over-the-counter medications but has been trying to increase her rest and fluids  Past Medical History  Diagnosis Date  . Chicken pox as a child  . Measles as a child  . Hyperlipidemia   . Hypertension   . Preventative health care 01/14/2011  . Allergic state 01/14/2011  . Tobacco abuse 03/24/2011  . Bronchitis 03/24/2011    Past Surgical History  Procedure Date  . Total knee replacement, right   . Tonsillectomy and adenoidectomy   . Breast surgery 1997    breast biopsy, right  . Right shoulder repai 1966    s/p fracture that healed poorly  . Carpal tunnel release, b/l 1990's  . Tubal ligation   . Right foot bunionectomy 2010    4 pins in lateral right foot    Family History  Problem Relation Age of Onset  . Hypertension Mother   . Hyperlipidemia Mother   . Coronary artery disease Mother   . Cancer Mother     breast  . Stroke Father   . Heart disease Father   . Hyperlipidemia Father   . Coronary artery  disease Father     s/p MI  . Diabetes Father   . Hypertension Father   . Hypertension Sister   . Hyperlipidemia Sister   . Obesity Sister   . Coronary artery disease Maternal Grandmother   . Heart attack Maternal Grandfather   . Diabetes Paternal Grandmother   . Hypertension Paternal Grandmother   . Hyperlipidemia Paternal Grandmother   . Obesity Paternal Grandmother   . Stroke Paternal Grandfather     History   Social History  . Marital Status: Married    Spouse Name: N/A    Number of Children: N/A  . Years of Education: N/A   Occupational History  . Not on file.   Social History Main Topics  . Smoking status: Current Everyday Smoker -- 0.2 packs/day    Types: Cigarettes  . Smokeless tobacco: Never Used  . Alcohol Use: Yes     occasional  . Drug Use: No  . Sexually Active: Not on file   Other Topics Concern  . Not on file   Social History Narrative  . No narrative on file    Current Outpatient Prescriptions on File Prior to Visit  Medication Sig Dispense Refill  . Calcium Carbonate (CALCIUM 500  PO) Take 1 tablet by mouth daily.        . clindamycin-benzoyl peroxide (BENZACLIN) gel Apply to lesions qhs  50 g  1  . cyclobenzaprine (FLEXERIL) 10 MG tablet Take 1 tablet (10 mg total) by mouth 2 (two) times daily as needed for muscle spasms (may cause sedation).  60 tablet  1  . diclofenac sodium (VOLTAREN) 1 % GEL Apply topically as needed.        . Fenofibrate 40 MG TABS Take 1 tablet (40 mg total) by mouth daily.  30 each  3  . fish oil-omega-3 fatty acids 1000 MG capsule Take 2 g by mouth 2 (two) times daily.        . Flaxseed, Linseed, (FLAX SEEDS PO) Take 1 capsule by mouth 3 (three) times daily.        . fluticasone (FLONASE) 50 MCG/ACT nasal spray Place 1 spray into the nose daily as needed for rhinitis.  16 g  2  . FOLIC ACID PO Take 1 tablet by mouth daily.        Marland Kitchen lovastatin (MEVACOR) 40 MG tablet Take 1 tablet (40 mg total) by mouth at bedtime.  30  tablet  2  . mupirocin (BACTROBAN) 2 % ointment Apply topically 2 (two) times daily. X 7 days prn lesion bid after that       . traMADol (ULTRAM) 50 MG tablet Take 1-2 tab as needed for pain. Maximum of 8 tabs per day  120 tablet  5  . triamterene-hydrochlorothiazide (MAXZIDE-25) 37.5-25 MG per tablet Take 1 each (1 tablet total) by mouth daily.  30 tablet  11  . VITAMIN D, ERGOCALCIFEROL, PO Take 1 tablet by mouth daily.         No current facility-administered medications on file prior to visit.    No Known Allergies  Review of Systems  Review of Systems  Constitutional: Positive for fever, chills and malaise/fatigue.  HENT: Positive for congestion. Negative for ear pain and sore throat.   Eyes: Negative for discharge.  Respiratory: Positive for cough, sputum production, shortness of breath and wheezing.   Cardiovascular: Negative for chest pain, palpitations and leg swelling.  Gastrointestinal: Positive for nausea, vomiting and diarrhea. Negative for heartburn, abdominal pain, constipation, blood in stool and melena.  Genitourinary: Negative for dysuria.  Musculoskeletal: Positive for myalgias. Negative for falls.  Skin: Negative for rash.  Neurological: Positive for headaches. Negative for loss of consciousness.  Endo/Heme/Allergies: Negative for polydipsia.  Psychiatric/Behavioral: Negative for depression and suicidal ideas. The patient is not nervous/anxious and does not have insomnia.     Objective  BP 144/106  Pulse 104  Temp(Src) 97.9 F (36.6 C) (Oral)  Ht 5\' 5"  (1.651 m)  Wt 166 lb 1.9 oz (75.352 kg)  BMI 27.64 kg/m2  SpO2 98%  Physical Exam  Physical Exam  Constitutional: She is oriented to person, place, and time. She appears distressed.       Mildly uncomfortable with coughing  HENT:  Head: Normocephalic and atraumatic.  Eyes: Conjunctivae are normal.  Neck: Neck supple. No thyromegaly present.  Cardiovascular: Normal rate, regular rhythm and normal heart  sounds.   No murmur heard. Pulmonary/Chest: Effort normal. No respiratory distress. She has wheezes.  Abdominal: Soft. Bowel sounds are normal. She exhibits no distension and no mass.  Musculoskeletal: She exhibits no edema.  Lymphadenopathy:    She has no cervical adenopathy.  Neurological: She is alert and oriented to person, place, and time.  Skin: Skin  is warm and dry. No rash noted. She is not diaphoretic.  Psychiatric: Memory, affect and judgment normal.  sy Lab Results  Component Value Date   TSH 1.62 01/05/2010   Lab Results  Component Value Date   WBC 6.8 01/05/2010   HGB 13.2 01/05/2010   HCT 38.3 01/05/2010   MCV 87.9 01/05/2010   PLT 221.0 01/05/2010   Lab Results  Component Value Date   CREATININE 1.1 01/14/2011   BUN 29* 01/14/2011   NA 140 01/14/2011   K 3.7 01/14/2011   CL 105 01/14/2011   CO2 27 01/14/2011   Lab Results  Component Value Date   ALT 20 01/14/2011   AST 27 01/14/2011   ALKPHOS 78 01/14/2011   BILITOT 0.5 01/14/2011   Lab Results  Component Value Date   CHOL 297* 01/14/2011   Lab Results  Component Value Date   HDL 63.10 01/14/2011   Lab Results  Component Value Date   LDLCALC 119* 12/10/2009   Lab Results  Component Value Date   TRIG 117.0 01/14/2011   Lab Results  Component Value Date   CHOLHDL 5 01/14/2011     Assessment & Plan  Tobacco abuse Down to just 3 cigarettes daily and no cig in past 5 days. Encouraged complete cessation  ESSENTIAL HYPERTENSION, BENIGN Mild elevation with acute illness, no changes recommended today.  Bronchitis Wheezing, viral vs bacterial, given Depo Medrol 40 mg IM today and started on Azithromycin and Mucinex, given Tussionex to use prn. Sent home to increase fluids and rest, call if no improvement

## 2011-04-06 ENCOUNTER — Telehealth: Payer: Self-pay | Admitting: Family Medicine

## 2011-04-06 MED ORDER — AZITHROMYCIN 250 MG PO TABS: 250.0000 mg | ORAL_TABLET | Freq: Every day | ORAL | Status: DC

## 2011-04-06 NOTE — Telephone Encounter (Signed)
Patient informed and rx sent to pharmacy. 

## 2011-04-06 NOTE — Telephone Encounter (Signed)
Please advise 

## 2011-04-06 NOTE — Telephone Encounter (Signed)
Patient is still very congested, can she get another zpak?

## 2011-04-06 NOTE — Telephone Encounter (Signed)
Yes, OK to give 1 refill if no improvement has to come in for reeval. Increase fluids and continue mucinex bid

## 2011-05-06 ENCOUNTER — Other Ambulatory Visit: Payer: PRIVATE HEALTH INSURANCE

## 2011-05-13 ENCOUNTER — Other Ambulatory Visit (INDEPENDENT_AMBULATORY_CARE_PROVIDER_SITE_OTHER): Payer: PRIVATE HEALTH INSURANCE

## 2011-05-13 DIAGNOSIS — E785 Hyperlipidemia, unspecified: Secondary | ICD-10-CM

## 2011-05-13 DIAGNOSIS — N189 Chronic kidney disease, unspecified: Secondary | ICD-10-CM

## 2011-05-13 LAB — COMPREHENSIVE METABOLIC PANEL
ALT: 20 U/L (ref 0–35)
CO2: 29 mEq/L (ref 19–32)
Calcium: 9.8 mg/dL (ref 8.4–10.5)
Chloride: 103 mEq/L (ref 96–112)
GFR: 42.66 mL/min — ABNORMAL LOW (ref >60.00)
Sodium: 139 mEq/L (ref 135–145)
Total Protein: 6.8 g/dL (ref 6.0–8.3)

## 2011-05-27 ENCOUNTER — Telehealth: Payer: Self-pay

## 2011-05-27 NOTE — Telephone Encounter (Signed)
Please advise 

## 2011-05-27 NOTE — Telephone Encounter (Signed)
So if she is willing she can have Lovastatin 10mg  1 tab po qhs, disp 30, 3 refills, this is cheap and works well. Have her do hepatic panel next month and repeat lipids and cholesterol in 3-4 months

## 2011-05-27 NOTE — Telephone Encounter (Signed)
Pt states she can't afford the Welchol. Pt stated it was going to cost her $244 a month. Pt would like something else called in for her cholesterol? Please advise?

## 2011-05-28 NOTE — Telephone Encounter (Signed)
Pt is taking Lovastatin 40 mg. After talking to MD, MD stated to take over the counter Niacin Extended Release 500 mg qhs, take a low fat snack 30 min before taking Niacin along with a 81 mg Aspirin. Flushing will appeart but usually gets better in a couple weeks. If after 2 weeks Niacin is tolerated start takin 2 tabs at bedtime.  Left a message for patient to return my call

## 2011-05-31 NOTE — Telephone Encounter (Signed)
Left a message for patient to return my call. 

## 2011-06-01 ENCOUNTER — Telehealth: Payer: Self-pay

## 2011-06-01 NOTE — Telephone Encounter (Signed)
Pt states she would like to try Crestor or Lipitor? Patient needs to know what strength each would be if she took on e so she can find out from [employer] company how much it would cost? Please advise? Pt also stated she would like a message left on her home answering machine if no answer.

## 2011-06-01 NOTE — Telephone Encounter (Signed)
Pt informed and states understandment

## 2011-06-01 NOTE — Telephone Encounter (Signed)
Crestor 10 or Lipitor/Atorvastatin 20 mg qhs would be her best options, all things being equal Crestor is my favorite

## 2011-06-02 NOTE — Telephone Encounter (Signed)
Message left with spouse

## 2011-06-15 ENCOUNTER — Encounter: Payer: Self-pay | Admitting: Family Medicine

## 2011-07-30 ENCOUNTER — Telehealth: Payer: Self-pay

## 2011-07-30 NOTE — Telephone Encounter (Signed)
Left a message for patient to return my call. 

## 2011-07-30 NOTE — Telephone Encounter (Signed)
Any time after beginning of May, too soon prior to that. Anytime May to July would be fine

## 2011-07-30 NOTE — Telephone Encounter (Signed)
Patient would like to know when she should come in for her lipids to be checked again? Please advise?

## 2011-08-03 NOTE — Telephone Encounter (Signed)
Left a message for patient to return my call. 

## 2011-08-03 NOTE — Telephone Encounter (Signed)
Left a message on patient's voice mail to return my call. 

## 2011-08-10 NOTE — Telephone Encounter (Signed)
Closing until patient returns my call

## 2011-08-26 NOTE — Telephone Encounter (Signed)
Patient informed and states she will call back when schools out for an appt.

## 2011-11-16 ENCOUNTER — Telehealth: Payer: Self-pay | Admitting: Family Medicine

## 2011-11-16 DIAGNOSIS — E785 Hyperlipidemia, unspecified: Secondary | ICD-10-CM

## 2011-11-16 NOTE — Telephone Encounter (Signed)
Order entered

## 2011-11-18 ENCOUNTER — Other Ambulatory Visit (INDEPENDENT_AMBULATORY_CARE_PROVIDER_SITE_OTHER): Payer: PRIVATE HEALTH INSURANCE

## 2011-11-18 ENCOUNTER — Other Ambulatory Visit: Payer: PRIVATE HEALTH INSURANCE

## 2011-11-18 DIAGNOSIS — E785 Hyperlipidemia, unspecified: Secondary | ICD-10-CM

## 2011-11-18 LAB — LIPID PANEL
HDL: 58.3 mg/dL (ref >39.00)
Total CHOL/HDL Ratio: 5
Triglycerides: 94 mg/dL (ref 0.0–149.0)
VLDL: 18.8 mg/dL (ref 0.0–40.0)

## 2011-11-19 LAB — LDL CHOLESTEROL, DIRECT: Direct LDL: 196 mg/dL

## 2011-11-19 NOTE — Progress Notes (Signed)
Quick Note:  Patient Informed and voiced understanding. Pt states she will call back to schedule an appt ______

## 2011-11-22 ENCOUNTER — Encounter: Payer: Self-pay | Admitting: Family Medicine

## 2011-11-22 ENCOUNTER — Ambulatory Visit (INDEPENDENT_AMBULATORY_CARE_PROVIDER_SITE_OTHER): Payer: PRIVATE HEALTH INSURANCE | Admitting: Family Medicine

## 2011-11-22 VITALS — BP 109/76 | HR 97 | Temp 97.6°F | Ht 65.0 in | Wt 173.4 lb

## 2011-11-22 DIAGNOSIS — E785 Hyperlipidemia, unspecified: Secondary | ICD-10-CM

## 2011-11-22 DIAGNOSIS — I10 Essential (primary) hypertension: Secondary | ICD-10-CM

## 2011-11-22 DIAGNOSIS — E782 Mixed hyperlipidemia: Secondary | ICD-10-CM

## 2011-11-22 MED ORDER — PSYLLIUM 58.6 % PO POWD: 1.0000 | Freq: Two times a day (BID) | ORAL | Status: AC

## 2011-11-22 MED ORDER — NIACIN ER (ANTIHYPERLIPIDEMIC) 500 MG PO TBCR: 2000.0000 mg | EXTENDED_RELEASE_TABLET | Freq: Every day | ORAL | Status: AC

## 2011-11-22 NOTE — Assessment & Plan Note (Signed)
Adequately controlled no changes.

## 2011-11-22 NOTE — Progress Notes (Signed)
Patient ID: Morgan Fitzgerald, female   DOB: April 22, 1945, 66 y.o.   MRN: [id number] Dorreen Oke [id number] [birth year] 11/22/2011      Progress Note-Follow Up  Subjective  Chief Complaint  Chief Complaint  Patient presents with  . Follow-up    cholesterol medication    HPI  Patient is a 66 year old Caucasian female in today for followup on her cholesterol. She reports she feels very well. She denies fatigue, malaise, myalgias, chest pain or palpitations, shortness of breath, GI or GU concerns. She is very hesitant to restart cholesterol meds secondary to a little she's had in the past. She reports taking numerous statins including Lipitor, simvastatin, Crestor and all of these causing myalgias. Review of her record shows she's also used fenofibrate and believes she had myalgias with that although she's not positive. WelChol was cost prohibitive. She continues to take her cholesterol and her flax and fish oils. She's taking niacin is 500 mg daily. She's trying the smaller meals she's cut out a lot of high-fat dairy products and she is very frustrated that her cholesterol has continued to increase.  Past Medical History  Diagnosis Date  . Chicken pox as a child  . Measles as a child  . Hyperlipidemia   . Hypertension   . Preventative health care 01/14/2011  . Allergic state 01/14/2011  . Tobacco abuse 03/24/2011  . Bronchitis 03/24/2011    Past Surgical History  Procedure Date  . Total knee replacement, right   . Tonsillectomy and adenoidectomy   . Breast surgery 1997    breast biopsy, right  . Right shoulder repai 1966    s/p fracture that healed poorly  . Carpal tunnel release, b/l 1990's  . Tubal ligation   . Right foot bunionectomy 2010    4 pins in lateral right foot    Family History  Problem Relation Age of Onset  . Hypertension Mother   . Hyperlipidemia Mother   . Coronary artery disease Mother   . Cancer Mother     breast  . Stroke Father   . Heart disease Father     . Hyperlipidemia Father   . Coronary artery disease Father     s/p MI  . Diabetes Father   . Hypertension Father   . Hypertension Sister   . Hyperlipidemia Sister   . Obesity Sister   . Coronary artery disease Maternal Grandmother   . Heart attack Maternal Grandfather   . Diabetes Paternal Grandmother   . Hypertension Paternal Grandmother   . Hyperlipidemia Paternal Grandmother   . Obesity Paternal Grandmother   . Stroke Paternal Grandfather     History   Social History  . Marital Status: Married    Spouse Name: N/A    Number of Children: N/A  . Years of Education: N/A   Occupational History  . Not on file.   Social History Main Topics  . Smoking status: Current Everyday Smoker -- 0.2 packs/day    Types: Cigarettes  . Smokeless tobacco: Never Used  . Alcohol Use: Yes     occasional  . Drug Use: No  . Sexually Active: Not on file   Other Topics Concern  . Not on file   Social History Narrative  . No narrative on file    Current Outpatient Prescriptions on File Prior to Visit  Medication Sig Dispense Refill  . aspirin 81 MG tablet Take 160 mg by mouth at bedtime.      . Calcium Carbonate (CALCIUM 500 PO) Take  1 tablet by mouth daily.        . fish oil-omega-3 fatty acids 1000 MG capsule Take 2 g by mouth 2 (two) times daily.        . Flaxseed, Linseed, (FLAX SEEDS PO) Take 1 capsule by mouth 3 (three) times daily.        . fluticasone (FLONASE) 50 MCG/ACT nasal spray Place 1 spray into the nose daily as needed for rhinitis.  16 g  2  . FOLIC ACID PO Take 1 tablet by mouth daily.        Marland Kitchen triamterene-hydrochlorothiazide (MAXZIDE-25) 37.5-25 MG per tablet Take 1 each (1 tablet total) by mouth daily.  30 tablet  11  . VITAMIN D, ERGOCALCIFEROL, PO Take 1 tablet by mouth daily.        Marland Kitchen DISCONTD: niacin (NIASPAN) 500 MG CR tablet Take 500 mg by mouth at bedtime.      . clindamycin-benzoyl peroxide (BENZACLIN) gel Apply to lesions qhs  50 g  1  . diclofenac sodium  (VOLTAREN) 1 % GEL Apply topically as needed.          No Known Allergies  Review of Systems  Review of Systems  Constitutional: Negative for fever and malaise/fatigue.  HENT: Negative for congestion.   Eyes: Negative for discharge.  Respiratory: Negative for shortness of breath.   Cardiovascular: Negative for chest pain, palpitations and leg swelling.  Gastrointestinal: Negative for nausea, abdominal pain and diarrhea.  Genitourinary: Negative for dysuria.  Musculoskeletal: Negative for falls.  Skin: Negative for rash.  Neurological: Negative for loss of consciousness and headaches.  Endo/Heme/Allergies: Negative for polydipsia.  Psychiatric/Behavioral: Negative for depression and suicidal ideas. The patient is not nervous/anxious and does not have insomnia.     Objective  BP 109/76  Pulse 97  Temp 97.6 F (36.4 C) (Temporal)  Ht 5\' 5"  (1.651 m)  Wt 173 lb 6.4 oz (78.654 kg)  BMI 28.86 kg/m2  SpO2 99%  Physical Exam  Physical Exam  Constitutional: She is oriented to person, place, and time and well-developed, well-nourished, and in no distress. No distress.  HENT:  Head: Normocephalic and atraumatic.  Eyes: Conjunctivae are normal.  Neck: Neck supple. No thyromegaly present.  Cardiovascular: Normal rate, regular rhythm and normal heart sounds.   No murmur heard. Pulmonary/Chest: Effort normal and breath sounds normal. She has no wheezes.  Abdominal: She exhibits no distension and no mass.  Musculoskeletal: She exhibits no edema.  Lymphadenopathy:    She has no cervical adenopathy.  Neurological: She is alert and oriented to person, place, and time.  Skin: Skin is warm and dry. No rash noted. She is not diaphoretic.  Psychiatric: Memory, affect and judgment normal.    Lab Results  Component Value Date   TSH 1.62 01/05/2010   Lab Results  Component Value Date   WBC 6.8 01/05/2010   HGB 13.2 01/05/2010   HCT 38.3 01/05/2010   MCV 87.9 01/05/2010   PLT 221.0  01/05/2010   Lab Results  Component Value Date   CREATININE 1.3* 05/13/2011   BUN 30* 05/13/2011   NA 139 05/13/2011   K 4.4 05/13/2011   CL 103 05/13/2011   CO2 29 05/13/2011   Lab Results  Component Value Date   ALT 20 05/13/2011   AST 23 05/13/2011   ALKPHOS 59 05/13/2011   BILITOT 0.6 05/13/2011   Lab Results  Component Value Date   CHOL 273* 11/18/2011   Lab Results  Component Value Date  HDL 58.30 11/18/2011   Lab Results  Component Value Date   LDLCALC 119* 12/10/2009   Lab Results  Component Value Date   TRIG 94.0 11/18/2011   Lab Results  Component Value Date   CHOLHDL 5 11/18/2011     Assessment & Plan  Mixed hyperlipidemia Discussed options with patient, agrees to redouble her efforts to avoid trans fats, minimize simple carbs and saturated fats, increase exercise, start Krill oil and increase Niacin to 2000qhs as tolerated  ESSENTIAL HYPERTENSION, BENIGN Adequately controlled no changes.

## 2011-11-22 NOTE — Patient Instructions (Addendum)

## 2011-11-22 NOTE — Assessment & Plan Note (Signed)
Discussed options with patient, agrees to redouble her efforts to avoid trans fats, minimize simple carbs and saturated fats, increase exercise, start Krill oil and increase Niacin to 2000qhs as tolerated

## 2012-01-16 ENCOUNTER — Other Ambulatory Visit: Payer: Self-pay | Admitting: Family Medicine

## 2012-03-09 ENCOUNTER — Ambulatory Visit (INDEPENDENT_AMBULATORY_CARE_PROVIDER_SITE_OTHER): Payer: Medicare Other | Admitting: Family Medicine

## 2012-03-09 ENCOUNTER — Encounter: Payer: Self-pay | Admitting: Family Medicine

## 2012-03-09 VITALS — BP 124/81 | HR 80 | Temp 98.5°F | Ht 65.0 in | Wt 172.1 lb

## 2012-03-09 DIAGNOSIS — E785 Hyperlipidemia, unspecified: Secondary | ICD-10-CM

## 2012-03-09 DIAGNOSIS — N189 Chronic kidney disease, unspecified: Secondary | ICD-10-CM

## 2012-03-09 DIAGNOSIS — Z23 Encounter for immunization: Secondary | ICD-10-CM

## 2012-03-09 DIAGNOSIS — Z780 Asymptomatic menopausal state: Secondary | ICD-10-CM

## 2012-03-09 DIAGNOSIS — I10 Essential (primary) hypertension: Secondary | ICD-10-CM

## 2012-03-09 DIAGNOSIS — R29890 Loss of height: Secondary | ICD-10-CM

## 2012-03-09 DIAGNOSIS — F17200 Nicotine dependence, unspecified, uncomplicated: Secondary | ICD-10-CM

## 2012-03-09 DIAGNOSIS — E782 Mixed hyperlipidemia: Secondary | ICD-10-CM

## 2012-03-09 DIAGNOSIS — Z720 Tobacco use: Secondary | ICD-10-CM

## 2012-03-09 DIAGNOSIS — Z0000 Encounter for general adult medical examination without abnormal findings: Secondary | ICD-10-CM

## 2012-03-09 DIAGNOSIS — Z1211 Encounter for screening for malignant neoplasm of colon: Secondary | ICD-10-CM

## 2012-03-09 DIAGNOSIS — D649 Anemia, unspecified: Secondary | ICD-10-CM

## 2012-03-09 DIAGNOSIS — R52 Pain, unspecified: Secondary | ICD-10-CM

## 2012-03-09 HISTORY — DX: Hyperlipidemia, unspecified: E78.5

## 2012-03-09 LAB — RENAL FUNCTION PANEL
CO2: 31 mEq/L (ref 19–32)
Calcium: 9.7 mg/dL (ref 8.4–10.5)
Chloride: 100 mEq/L (ref 96–112)
GFR: 55.29 mL/min — ABNORMAL LOW (ref >60.00)
Sodium: 138 mEq/L (ref 135–145)

## 2012-03-09 LAB — HEPATIC FUNCTION PANEL
ALT: 21 U/L (ref 0–35)
AST: 25 U/L (ref 0–37)
Alkaline Phosphatase: 83 U/L (ref 39–117)
Bilirubin, Direct: 0 mg/dL (ref 0.0–0.3)
Total Bilirubin: 0.4 mg/dL (ref 0.3–1.2)

## 2012-03-09 LAB — CBC
HCT: 37.2 % (ref 36.0–46.0)
Hemoglobin: 12.5 g/dL (ref 12.0–15.0)
RBC: 4.26 Mil/uL (ref 3.87–5.11)
RDW: 12.6 % (ref 11.5–14.6)
WBC: 7.4 10*3/uL (ref 4.5–10.5)

## 2012-03-09 LAB — LDL CHOLESTEROL, DIRECT: Direct LDL: 158.7 mg/dL

## 2012-03-09 LAB — LIPID PANEL
HDL: 47.8 mg/dL (ref >39.00)
Total CHOL/HDL Ratio: 5
VLDL: 18.6 mg/dL (ref 0.0–40.0)

## 2012-03-09 MED ORDER — ZOSTER VACCINE LIVE 19400 UNT/0.65ML ~~LOC~~ SOLR: 0.6500 mL | Freq: Once | SUBCUTANEOUS | Status: AC

## 2012-03-09 MED ORDER — TRIAMTERENE-HCTZ 37.5-25 MG PO TABS: 1.0000 | ORAL_TABLET | Freq: Every day | ORAL | Status: AC

## 2012-03-09 MED ORDER — DICLOFENAC SODIUM 1 % TD GEL: TRANSDERMAL | Status: AC

## 2012-03-09 NOTE — Progress Notes (Signed)
Patient ID: Morgan Fitzgerald, female   DOB: 1946-03-10, 66 y.o.   MRN: [id number] Morgan Fitzgerald [id number] December 20, 1945 03/09/2012      Progress Note New Patient  Subjective  Chief Complaint  Chief Complaint  Patient presents with  . Medicare Wellness Visit    HPI  Patient is a 66 year old Caucasian female in today for wellness visit and checkup. She reports she's feeling well. She's not had any recent illness, fevers, chills, chest pain, palpitations, shortness of breath, GI or GU complaints. Her allergies have not flared and she's had no recent bronchitis. She has noted she's doing well at home. She denies any trouble paying her bills or functioning at home. Is able to cook her own meals and  clean her home without any significant difficulty. She offers no acute concerns today. No falls in the home. She denies any depression or concerns in this regard.Last had a bone density about 8-10 years ago and a colonoscopy for over 10 years ago.  Past Medical History  Diagnosis Date  . Chicken pox as a child  . Measles as a child  . Hyperlipidemia   . Hypertension   . Preventative health care 01/14/2011  . Allergic state 01/14/2011  . Tobacco abuse 03/24/2011  . Bronchitis 03/24/2011  . Hyperlipidemia 03/09/2012    Past Surgical History  Procedure Date  . Total knee replacement, right   . Tonsillectomy and adenoidectomy   . Breast surgery 1997    breast biopsy, right  . Right shoulder repai 1966    s/p fracture that healed poorly  . Carpal tunnel release, b/l 1990's  . Tubal ligation   . Right foot bunionectomy 2010    4 pins in lateral right foot    Family History  Problem Relation Age of Onset  . Hypertension Mother   . Hyperlipidemia Mother   . Coronary artery disease Mother   . Cancer Mother     breast  . Stroke Father   . Heart disease Father   . Hyperlipidemia Father   . Coronary artery disease Father     s/p MI  . Diabetes Father   . Hypertension Father   .  Hypertension Sister   . Hyperlipidemia Sister   . Obesity Sister   . Coronary artery disease Maternal Grandmother   . Heart attack Maternal Grandfather   . Diabetes Paternal Grandmother   . Hypertension Paternal Grandmother   . Hyperlipidemia Paternal Grandmother   . Obesity Paternal Grandmother   . Stroke Paternal Grandfather     History   Social History  . Marital Status: Married    Spouse Name: N/A    Number of Children: N/A  . Years of Education: N/A   Occupational History  . Not on file.   Social History Main Topics  . Smoking status: Current Every Day Smoker -- 0.2 packs/day    Types: Cigarettes  . Smokeless tobacco: Never Used  . Alcohol Use: Yes     Comment: occasional  . Drug Use: No  . Sexually Active: Not on file   Other Topics Concern  . Not on file   Social History Narrative  . No narrative on file    Current Outpatient Prescriptions on File Prior to Visit  Medication Sig Dispense Refill  . aspirin 81 MG tablet Take 81 mg by mouth at bedtime.       Marland [employer] Bioflavonoid Products (BIOFLEX) TABS Take by mouth.      . fish oil-omega-3 fatty acids 1000  MG capsule Take 2 g by mouth 2 (two) times daily.        . Flaxseed, Linseed, (FLAX SEEDS PO) Take 1 capsule by mouth 3 (three) times daily.        Marland Kitchen FOLIC ACID PO Take 1 tablet by mouth daily.        . niacin (NIASPAN) 500 MG CR tablet Take 4 tablets (2,000 mg total) by mouth at bedtime.      . psyllium (METAMUCIL SMOOTH TEXTURE) 58.6 % powder Take 1 packet by mouth 2 (two) times daily.  283 g  12  . VITAMIN C, CALCIUM ASCORBATE, PO Take by mouth.      Marland Kitchen VITAMIN D, ERGOCALCIFEROL, PO Take 1 tablet by mouth daily.        . [DISCONTINUED] triamterene-hydrochlorothiazide (MAXZIDE-25) 37.5-25 MG per tablet TAKE ONE TABLET BY MOUTH DAILY.  30 tablet  6  . Calcium Carbonate (CALCIUM 500 PO) Take 1 tablet by mouth daily.        . fluticasone (FLONASE) 50 MCG/ACT nasal spray Place 1 spray into the nose daily as needed for  rhinitis.  16 g  2  . Plant Sterols and Stanols (CHOLEST OFF PO) Take by mouth.        No Known Allergies  Review of Systems  Review of Systems  Constitutional: Negative for fever, chills and malaise/fatigue.  HENT: Negative for hearing loss, nosebleeds and congestion.   Eyes: Negative for discharge.  Respiratory: Negative for cough, sputum production, shortness of breath and wheezing.   Cardiovascular: Negative for chest pain, palpitations and leg swelling.  Gastrointestinal: Negative for heartburn, nausea, vomiting, abdominal pain, diarrhea, constipation and blood in stool.  Genitourinary: Negative for dysuria, urgency, frequency and hematuria.  Musculoskeletal: Negative for myalgias, back pain and falls.  Skin: Negative for rash.  Neurological: Negative for dizziness, tremors, sensory change, focal weakness, loss of consciousness, weakness and headaches.  Endo/Heme/Allergies: Negative for polydipsia. Does not bruise/bleed easily.  Psychiatric/Behavioral: Negative for depression and suicidal ideas. The patient is not nervous/anxious and does not have insomnia.     Objective  BP 124/81  Pulse 80  Temp 98.5 F (36.9 C) (Temporal)  Ht 5\' 5"  (1.651 m)  Wt 172 lb 1.9 oz (78.073 kg)  BMI 28.64 kg/m2  SpO2 98%  Physical Exam  Physical Exam  Constitutional: She is oriented to person, place, and time and well-developed, well-nourished, and in no distress. No distress.  HENT:  Head: Normocephalic and atraumatic.  Right Ear: External ear normal.  Left Ear: External ear normal.  Nose: Nose normal.  Mouth/Throat: Oropharynx is clear and moist. No oropharyngeal exudate.  Eyes: Conjunctivae normal are normal. Pupils are equal, round, and reactive to light. Right eye exhibits no discharge. Left eye exhibits no discharge. No scleral icterus.  Neck: Normal range of motion. Neck supple. No thyromegaly present.  Cardiovascular: Normal rate, regular rhythm, normal heart sounds and intact  distal pulses.   No murmur heard. Pulmonary/Chest: Effort normal and breath sounds normal. No respiratory distress. She has no wheezes. She has no rales.  Abdominal: Soft. Bowel sounds are normal. She exhibits no distension and no mass. There is no tenderness.  Musculoskeletal: Normal range of motion. She exhibits no edema and no tenderness.  Lymphadenopathy:    She has no cervical adenopathy.  Neurological: She is alert and oriented to person, place, and time. She has normal reflexes. No cranial nerve deficit. Coordination normal.  Skin: Skin is warm and dry. No rash noted. She  is not diaphoretic.  Psychiatric: Mood, memory and affect normal.       Assessment & Plan  ESSENTIAL HYPERTENSION, BENIGN Adequately controlled, Maxzide refill given today.   Preventative health care Up to date on pap smear. MGM in 2012, agrees to repeat screening colonoscopy. She reports it has been greater than 10 years since her last exam. Bone density was last done 8-10 years ago. Repeat is ordered today. Given pneumovax today. Zostavax prescription is given to take to the pharmacy for use. Patient does not have any advanced directives. Is encouraged to develop and discuss with family  Tobacco abuse Unfortunately still smoking roughly 3 cigarettes today, agrees to try to quit completely. Encouraged to consider the ecig if she continues to have trouble quitting.   RENAL INSUFFICIENCY, CHRONIC Very mild, encouraged adequate hydration and repeat a renal panel tomorrow.   ANEMIA Resolved with last set of lab work in 2011, repeat cbc tody  Mixed hyperlipidemia Encouraged to continue Niacin, switch from fish oil to [employer], avoid trans fats, increase exercise, restart cholest offor start red yeast rice, take coQ10 Enzymel, recheck lipids in 6 months and today

## 2012-03-09 NOTE — Assessment & Plan Note (Addendum)
Up to date on pap smear. MGM in 2012, agrees to repeat screening colonoscopy. She reports it has been greater than 10 years since her last exam. Bone density was last done 8-10 years ago. Repeat is ordered today. Given pneumovax today. Zostavax prescription is given to take to the pharmacy for use. Patient does not have any advanced directives. Is encouraged to develop and discuss with family. Unable to bill a Medicare Wellness visit secondary to no [facility] chart exam performed and patient not returning calls.

## 2012-03-09 NOTE — Assessment & Plan Note (Signed)
Encouraged to continue Niacin, switch from fish oil to [employer], avoid trans fats, increase exercise, restart cholest offor start red yeast rice, take coQ10 Enzymel, recheck lipids in 6 months and today

## 2012-03-09 NOTE — Assessment & Plan Note (Signed)
Very mild, encouraged adequate hydration and repeat a renal panel tomorrow.

## 2012-03-09 NOTE — Assessment & Plan Note (Signed)
Adequately controlled, Maxzide refill given today.

## 2012-03-09 NOTE — Assessment & Plan Note (Signed)
Unfortunately still smoking roughly 3 cigarettes today, agrees to try to quit completely. Encouraged to consider the ecig if she continues to have trouble quitting.

## 2012-03-09 NOTE — Assessment & Plan Note (Signed)
Resolved with last set of lab work in 2011, repeat cbc tody

## 2012-03-09 NOTE — Patient Instructions (Addendum)
Start Mega[employer] krill oil caps when fish oil finished Start either the [employer] off or Red Yeast rice Avoid trans fats and minimize simple carbs/saturated fats  Preventive Care for Adults, Female A healthy lifestyle and preventive care can promote health and wellness. Preventive health guidelines for women include the following key practices.  A routine yearly physical is a good way to check with your caregiver about your health and preventive screening. It is a chance to share any concerns and updates on your health, and to receive a thorough exam.  Visit your dentist for a routine exam and preventive care every 6 months. Brush your teeth twice a day and floss once a day. Good oral hygiene prevents tooth decay and gum disease.  The frequency of eye exams is based on your age, health, family medical history, use of contact lenses, and other factors. Follow your caregiver's recommendations for frequency of eye exams.  Eat a healthy diet. Foods like vegetables, fruits, whole grains, low-fat dairy products, and lean protein foods contain the nutrients you need without too many calories. Decrease your intake of foods high in solid fats, added sugars, and salt. Eat the right amount of calories for you.Get information about a proper diet from your caregiver, if necessary.  Regular physical exercise is one of the most important things you can do for your health. Most adults should get at least 150 minutes of moderate-intensity exercise (any activity that increases your heart rate and causes you to sweat) each week. In addition, most adults need muscle-strengthening exercises on 2 or more days a week.  Maintain a healthy weight. The body mass index (BMI) is a screening tool to identify possible weight problems. It provides an estimate of body fat based on height and weight. Your caregiver can help determine your BMI, and can help you achieve or maintain a healthy weight.For adults 20 years and older:  A  BMI below 18.5 is considered underweight.  A BMI of 18.5 to 24.9 is normal.  A BMI of 25 to 29.9 is considered overweight.  A BMI of 30 and above is considered obese.  Maintain normal blood lipids and cholesterol levels by exercising and minimizing your intake of saturated fat. Eat a balanced diet with plenty of fruit and vegetables. Blood tests for lipids and cholesterol should begin at age 16 and be repeated every 5 years. If your lipid or cholesterol levels are high, you are over 50, or you are at high risk for heart disease, you may need your cholesterol levels checked more frequently.Ongoing high lipid and cholesterol levels should be treated with medicines if diet and exercise are not effective.  If you smoke, find out from your caregiver how to quit. If you do not use tobacco, do not start.  If you are pregnant, do not drink alcohol. If you are breastfeeding, be very cautious about drinking alcohol. If you are not pregnant and choose to drink alcohol, do not exceed 1 drink per day. One drink is considered to be 12 ounces (355 mL) of beer, 5 ounces (148 mL) of wine, or 1.5 ounces (44 mL) of liquor.  Avoid use of street drugs. Do not share needles with anyone. Ask for help if you need support or instructions about stopping the use of drugs.  High blood pressure causes heart disease and increases the risk of stroke. Your blood pressure should be checked at least every 1 to 2 years. Ongoing high blood pressure should be treated with medicines if weight loss  and exercise are not effective.  If you are 22 to 65 years old, ask your caregiver if you should take aspirin to prevent strokes.  Diabetes screening involves taking a blood sample to check your fasting blood sugar level. This should be done once every 3 years, after age 71, if you are within normal weight and without risk factors for diabetes. Testing should be considered at a younger age or be carried out more frequently if you are  overweight and have at least 1 risk factor for diabetes.  Breast cancer screening is essential preventive care for women. You should practice "breast self-awareness." This means understanding the normal appearance and feel of your breasts and may include breast self-examination. Any changes detected, no matter how small, should be reported to a caregiver. Women in their 66s and 30s should have a clinical breast exam (CBE) by a caregiver as part of a regular health exam every 1 to 3 years. After age 15, women should have a CBE every year. Starting at age 25, women should consider having a mammography (breast X-ray test) every year. Women who have a family history of breast cancer should talk to their caregiver about genetic screening. Women at a high risk of breast cancer should talk to their caregivers about having magnetic resonance imaging (MRI) and a mammography every year.  The Pap test is a screening test for cervical cancer. A Pap test can show cell changes on the cervix that might become cervical cancer if left untreated. A Pap test is a procedure in which cells are obtained and examined from the lower end of the uterus (cervix).  Women should have a Pap test starting at age 62.  Between ages 54 and 51, Pap tests should be repeated every 2 years.  Beginning at age 26, you should have a Pap test every 3 years as long as the past 3 Pap tests have been normal.  Some women have medical problems that increase the chance of getting cervical cancer. Talk to your caregiver about these problems. It is especially important to talk to your caregiver if a new problem develops soon after your last Pap test. In these cases, your caregiver may recommend more frequent screening and Pap tests.  The above recommendations are the same for women who have or have not gotten the vaccine for human papillomavirus (HPV).  If you had a hysterectomy for a problem that was not cancer or a condition that could lead to  cancer, then you no longer need Pap tests. Even if you no longer need a Pap test, a regular exam is a good idea to make sure no other problems are starting.  If you are between ages 52 and 42, and you have had normal Pap tests going back 10 years, you no longer need Pap tests. Even if you no longer need a Pap test, a regular exam is a good idea to make sure no other problems are starting.  If you have had past treatment for cervical cancer or a condition that could lead to cancer, you need Pap tests and screening for cancer for at least 20 years after your treatment.  If Pap tests have been discontinued, risk factors (such as a new sexual partner) need to be reassessed to determine if screening should be resumed.  The HPV test is an additional test that may be used for cervical cancer screening. The HPV test looks for the virus that can cause the cell changes on the cervix. The  cells collected during the Pap test can be tested for HPV. The HPV test could be used to screen women aged 109 years and older, and should be used in women of any age who have unclear Pap test results. After the age of 76, women should have HPV testing at the same frequency as a Pap test.  Colorectal cancer can be detected and often prevented. Most routine colorectal cancer screening begins at the age of 11 and continues through age 71. However, your caregiver may recommend screening at an earlier age if you have risk factors for colon cancer. On a yearly basis, your caregiver may provide home test kits to check for hidden blood in the stool. Use of a small camera at the end of a tube, to directly examine the colon (sigmoidoscopy or colonoscopy), can detect the earliest forms of colorectal cancer. Talk to your caregiver about this at age 61, when routine screening begins. Direct examination of the colon should be repeated every 5 to 10 years through age 38, unless early forms of pre-cancerous polyps or small growths are  found.  Hepatitis C blood testing is recommended for all people born from [birth year] through 1965 and any individual with known risks for hepatitis C.  Practice safe sex. Use condoms and avoid high-risk sexual practices to reduce the spread of sexually transmitted infections (STIs). STIs include gonorrhea, chlamydia, syphilis, trichomonas, herpes, HPV, and human immunodeficiency virus (HIV). Herpes, HIV, and HPV are viral illnesses that have no cure. They can result in disability, cancer, and death. Sexually active women aged 7 and younger should be checked for chlamydia. Older women with new or multiple partners should also be tested for chlamydia. Testing for other STIs is recommended if you are sexually active and at increased risk.  Osteoporosis is a disease in which the bones lose minerals and strength with aging. This can result in serious bone fractures. The risk of osteoporosis can be identified using a bone density scan. Women ages 50 and over and women at risk for fractures or osteoporosis should discuss screening with their caregivers. Ask your caregiver whether you should take a calcium supplement or vitamin D to reduce the rate of osteoporosis.  Menopause can be associated with physical symptoms and risks. Hormone replacement therapy is available to decrease symptoms and risks. You should talk to your caregiver about whether hormone replacement therapy is right for you.  Use sunscreen with sun protection factor (SPF) of 30 or more. Apply sunscreen liberally and repeatedly throughout the day. You should seek shade when your shadow is shorter than you. Protect yourself by wearing long sleeves, pants, a wide-brimmed hat, and sunglasses year round, whenever you are outdoors.  Once a month, do a whole body skin exam, using a mirror to look at the skin on your back. Notify your caregiver of new moles, moles that have irregular borders, moles that are larger than a pencil eraser, or moles that have  changed in shape or color.  Stay current with required immunizations.  Influenza. You need a dose every fall (or winter). The composition of the flu vaccine changes each year, so being vaccinated once is not enough.  Pneumococcal polysaccharide. You need 1 to 2 doses if you smoke cigarettes or if you have certain chronic medical conditions. You need 1 dose at age 4 (or older) if you have never been vaccinated.  Tetanus, diphtheria, pertussis (Tdap, Td). Get 1 dose of Tdap vaccine if you are younger than age 33, are over 5  and have contact with an infant, are a Dietitian, are pregnant, or simply want to be protected from whooping cough. After that, you need a Td booster dose every 10 years. Consult your caregiver if you have not had at least 3 tetanus and diphtheria-containing shots sometime in your life or have a deep or dirty wound.  HPV. You need this vaccine if you are a woman age 60 or younger. The vaccine is given in 3 doses over 6 months.  Measles, mumps, rubella (MMR). You need at least 1 dose of MMR if you were born in 1957 or later. You may also need a second dose.  Meningococcal. If you are age 45 to 74 and a first-year college student living in a residence hall, or have one of several medical conditions, you need to get vaccinated against meningococcal disease. You may also need additional booster doses.  Zoster (shingles). If you are age 8 or older, you should get this vaccine.  Varicella (chickenpox). If you have never had chickenpox or you were vaccinated but received only 1 dose, talk to your caregiver to find out if you need this vaccine.  Hepatitis A. You need this vaccine if you have a specific risk factor for hepatitis A virus infection or you simply wish to be protected from this disease. The vaccine is usually given as 2 doses, 6 to 18 months apart.  Hepatitis B. You need this vaccine if you have a specific risk factor for hepatitis B virus infection or you  simply wish to be protected from this disease. The vaccine is given in 3 doses, usually over 6 months. Preventive Services / Frequency Ages 78 to 33  Blood pressure check.** / Every 1 to 2 years.  Lipid and cholesterol check.** / Every 5 years beginning at age 4.  Clinical breast exam.** / Every 3 years for women in their 42s and 71s.  Pap test.** / Every 2 years from ages 40 through 104. Every 3 years starting at age 76 through age 11 or 91 with a history of 3 consecutive normal Pap tests.  HPV screening.** / Every 3 years from ages 28 through ages 33 to 23 with a history of 3 consecutive normal Pap tests.  Hepatitis C blood test.** / For any individual with known risks for hepatitis C.  Skin self-exam. / Monthly.  Influenza immunization.** / Every year.  Pneumococcal polysaccharide immunization.** / 1 to 2 doses if you smoke cigarettes or if you have certain chronic medical conditions.  Tetanus, diphtheria, pertussis (Tdap, Td) immunization. / A one-time dose of Tdap vaccine. After that, you need a Td booster dose every 10 years.  HPV immunization. / 3 doses over 6 months, if you are 19 and younger.  Measles, mumps, rubella (MMR) immunization. / You need at least 1 dose of MMR if you were born in 1957 or later. You may also need a second dose.  Meningococcal immunization. / 1 dose if you are age 7 to 44 and a first-year college student living in a residence hall, or have one of several medical conditions, you need to get vaccinated against meningococcal disease. You may also need additional booster doses.  Varicella immunization.** / Consult your caregiver.  Hepatitis A immunization.** / Consult your caregiver. 2 doses, 6 to 18 months apart.  Hepatitis B immunization.** / Consult your caregiver. 3 doses usually over 6 months. Ages 24 to 65  Blood pressure check.** / Every 1 to 2 years.  Lipid and cholesterol check.** /  Every 5 years beginning at age 42.  Clinical breast  exam.** / Every year after age 59.  Mammogram.** / Every year beginning at age 24 and continuing for as long as you are in good health. Consult with your caregiver.  Pap test.** / Every 3 years starting at age 76 through age 20 or 24 with a history of 3 consecutive normal Pap tests.  HPV screening.** / Every 3 years from ages 34 through ages 81 to 22 with a history of 3 consecutive normal Pap tests.  Fecal occult blood test (FOBT) of stool. / Every year beginning at age 41 and continuing until age 50. You may not need to do this test if you get a colonoscopy every 10 years.  Flexible sigmoidoscopy or colonoscopy.** / Every 5 years for a flexible sigmoidoscopy or every 10 years for a colonoscopy beginning at age 73 and continuing until age 44.  Hepatitis C blood test.** / For all people born from [birth year] through 1964 and any individual with known risks for hepatitis C.  Skin self-exam. / Monthly.  Influenza immunization.** / Every year.  Pneumococcal polysaccharide immunization.** / 1 to 2 doses if you smoke cigarettes or if you have certain chronic medical conditions.  Tetanus, diphtheria, pertussis (Tdap, Td) immunization.** / A one-time dose of Tdap vaccine. After that, you need a Td booster dose every 10 years.  Measles, mumps, rubella (MMR) immunization. / You need at least 1 dose of MMR if you were born in 1957 or later. You may also need a second dose.  Varicella immunization.** / Consult your caregiver.  Meningococcal immunization.** / Consult your caregiver.  Hepatitis A immunization.** / Consult your caregiver. 2 doses, 6 to 18 months apart.  Hepatitis B immunization.** / Consult your caregiver. 3 doses, usually over 6 months. Ages 52 and over  Blood pressure check.** / Every 1 to 2 years.  Lipid and cholesterol check.** / Every 5 years beginning at age 30.  Clinical breast exam.** / Every year after age 47.  Mammogram.** / Every year beginning at age 36 and continuing  for as long as you are in good health. Consult with your caregiver.  Pap test.** / Every 3 years starting at age 36 through age 43 or 55 with a 3 consecutive normal Pap tests. Testing can be stopped between 65 and 70 with 3 consecutive normal Pap tests and no abnormal Pap or HPV tests in the past 10 years.  HPV screening.** / Every 3 years from ages 9 through ages 7 or 50 with a history of 3 consecutive normal Pap tests. Testing can be stopped between 65 and 70 with 3 consecutive normal Pap tests and no abnormal Pap or HPV tests in the past 10 years.  Fecal occult blood test (FOBT) of stool. / Every year beginning at age 63 and continuing until age 84. You may not need to do this test if you get a colonoscopy every 10 years.  Flexible sigmoidoscopy or colonoscopy.** / Every 5 years for a flexible sigmoidoscopy or every 10 years for a colonoscopy beginning at age 62 and continuing until age 63.  Hepatitis C blood test.** / For all people born from [birth year] through 1965 and any individual with known risks for hepatitis C.  Osteoporosis screening.** / A one-time screening for women ages 63 and over and women at risk for fractures or osteoporosis.  Skin self-exam. / Monthly.  Influenza immunization.** / Every year.  Pneumococcal polysaccharide immunization.** / 1 dose at age 30 (  or older) if you have never been vaccinated.  Tetanus, diphtheria, pertussis (Tdap, Td) immunization. / A one-time dose of Tdap vaccine if you are over 65 and have contact with an infant, are a Dietitian, or simply want to be protected from whooping cough. After that, you need a Td booster dose every 10 years.  Varicella immunization.** / Consult your caregiver.  Meningococcal immunization.** / Consult your caregiver.  Hepatitis A immunization.** / Consult your caregiver. 2 doses, 6 to 18 months apart.  Hepatitis B immunization.** / Check with your caregiver. 3 doses, usually over 6 months. ** Family history  and personal history of risk and conditions may change your caregiver's recommendations. Document Released: 06/01/2001 Document Revised: 06/28/2011 Document Reviewed: 08/31/2010 [employer] Patient Information 2013 [place].

## 2012-03-10 ENCOUNTER — Telehealth: Payer: Self-pay | Admitting: Family Medicine

## 2012-03-10 NOTE — Telephone Encounter (Signed)
FYI

## 2012-03-10 NOTE — Telephone Encounter (Signed)
Patient Information:  Caller Name: Debroah  Phone: [phone]  Patient: Helma, Achen  Gender: Female  DOB: 07-Dec-1945  Age: 66 Years  PCP: [facility])   Symptoms  Reason For Call & Symptoms: Immunization reaction  Reviewed Health History In EMR: Yes  Reviewed Medications In EMR: Yes  Reviewed Allergies In EMR: Yes  Date of Onset of Symptoms: 02/18/2012  Treatments Tried: Took two tylenol with no improvement  Treatments Tried Worked: No  Guideline(s) Used:  Immunization Reactions  Immunization Reactions  Disposition Per Guideline:   Home Care  Reason For Disposition Reached:   Mild immunization reaction  Advice Given:  Cold Pack for Local Reaction at Injection Site:  Apply a cold pack or ice in a wet washcloth to the area for 20 minutes. Repeat in 1 hour.  Pain and Fever Medicines:  For pain or fever relief, take either acetaminophen or ibuprofen.  Call Back If:  Fever lasts more than 3 days  Pain lasts more than 3 days  Injection site starts to look infected  You become worse.  Office Follow Up:  Does the office need to follow up with this patient?: No  Instructions For The Office: N/A  RN Note:  Redness to Pneumonia injection site approx 2 inches in diameter to left arm.  Warm to touch. Not raised.

## 2012-03-10 NOTE — Progress Notes (Signed)
Quick Note:  Patient Informed and voiced understanding ______ 

## 2012-03-15 ENCOUNTER — Encounter: Payer: Self-pay | Admitting: Family Medicine

## 2012-03-15 ENCOUNTER — Encounter: Payer: Self-pay | Admitting: Gastroenterology

## 2012-04-06 ENCOUNTER — Telehealth: Payer: Self-pay

## 2012-04-06 NOTE — Telephone Encounter (Signed)
Pt called stating that she needed records of having cholesterol checked in 2012. Pt will pick up a copy of lab results on 04-10-12. Lab results in front cabinet

## 2012-04-17 ENCOUNTER — Telehealth: Payer: Self-pay | Admitting: Family Medicine

## 2012-04-21 ENCOUNTER — Telehealth: Payer: Self-pay

## 2012-04-21 NOTE — Telephone Encounter (Signed)
Morgan Fitzgerald informed pt

## 2012-04-21 NOTE — Telephone Encounter (Signed)
Pt left a message stating that she needs a letter stating she had her cholesterol checked in 2012.  Letter printed and a message left for patient to return my call.  Letter put at front desk

## 2012-05-03 ENCOUNTER — Encounter: Payer: Medicare Other | Admitting: Gastroenterology

## 2012-08-29 ENCOUNTER — Other Ambulatory Visit: Payer: Self-pay

## 2012-09-05 ENCOUNTER — Ambulatory Visit: Payer: Self-pay | Admitting: Family Medicine

## 2013-04-18 NOTE — Telephone Encounter (Signed)
Close Encounter 

## 2013-05-14 ENCOUNTER — Other Ambulatory Visit: Payer: Self-pay | Admitting: Gastroenterology

## 2015-04-25 DIAGNOSIS — M9903 Segmental and somatic dysfunction of lumbar region: Secondary | ICD-10-CM | POA: Diagnosis not present

## 2015-04-25 DIAGNOSIS — M62830 Muscle spasm of back: Secondary | ICD-10-CM | POA: Diagnosis not present

## 2015-04-25 DIAGNOSIS — M5136 Other intervertebral disc degeneration, lumbar region: Secondary | ICD-10-CM | POA: Diagnosis not present

## 2015-04-25 DIAGNOSIS — M5441 Lumbago with sciatica, right side: Secondary | ICD-10-CM | POA: Diagnosis not present

## 2015-05-07 DIAGNOSIS — I10 Essential (primary) hypertension: Secondary | ICD-10-CM | POA: Diagnosis not present

## 2015-05-07 DIAGNOSIS — M1990 Unspecified osteoarthritis, unspecified site: Secondary | ICD-10-CM | POA: Diagnosis not present

## 2015-05-07 DIAGNOSIS — D229 Melanocytic nevi, unspecified: Secondary | ICD-10-CM | POA: Diagnosis not present

## 2015-05-07 DIAGNOSIS — E7800 Pure hypercholesterolemia, unspecified: Secondary | ICD-10-CM | POA: Diagnosis not present

## 2015-05-07 DIAGNOSIS — Z0000 Encounter for general adult medical examination without abnormal findings: Secondary | ICD-10-CM | POA: Diagnosis not present

## 2015-05-07 DIAGNOSIS — K635 Polyp of colon: Secondary | ICD-10-CM | POA: Diagnosis not present

## 2015-05-07 DIAGNOSIS — M859 Disorder of bone density and structure, unspecified: Secondary | ICD-10-CM | POA: Diagnosis not present

## 2015-05-07 DIAGNOSIS — N183 Chronic kidney disease, stage 3 (moderate): Secondary | ICD-10-CM | POA: Diagnosis not present

## 2015-05-07 DIAGNOSIS — F17211 Nicotine dependence, cigarettes, in remission: Secondary | ICD-10-CM | POA: Diagnosis not present

## 2015-05-07 DIAGNOSIS — M109 Gout, unspecified: Secondary | ICD-10-CM | POA: Diagnosis not present

## 2015-05-07 DIAGNOSIS — E6609 Other obesity due to excess calories: Secondary | ICD-10-CM | POA: Diagnosis not present

## 2015-05-09 DIAGNOSIS — M5136 Other intervertebral disc degeneration, lumbar region: Secondary | ICD-10-CM | POA: Diagnosis not present

## 2015-05-09 DIAGNOSIS — M9903 Segmental and somatic dysfunction of lumbar region: Secondary | ICD-10-CM | POA: Diagnosis not present

## 2015-05-09 DIAGNOSIS — M5441 Lumbago with sciatica, right side: Secondary | ICD-10-CM | POA: Diagnosis not present

## 2015-05-09 DIAGNOSIS — M62830 Muscle spasm of back: Secondary | ICD-10-CM | POA: Diagnosis not present

## 2015-05-16 DIAGNOSIS — S82142D Displaced bicondylar fracture of left tibia, subsequent encounter for closed fracture with routine healing: Secondary | ICD-10-CM | POA: Diagnosis not present

## 2015-05-21 DIAGNOSIS — S82142D Displaced bicondylar fracture of left tibia, subsequent encounter for closed fracture with routine healing: Secondary | ICD-10-CM | POA: Diagnosis not present

## 2015-05-21 DIAGNOSIS — M9903 Segmental and somatic dysfunction of lumbar region: Secondary | ICD-10-CM | POA: Diagnosis not present

## 2015-05-21 DIAGNOSIS — M5136 Other intervertebral disc degeneration, lumbar region: Secondary | ICD-10-CM | POA: Diagnosis not present

## 2015-05-21 DIAGNOSIS — M5441 Lumbago with sciatica, right side: Secondary | ICD-10-CM | POA: Diagnosis not present

## 2015-05-21 DIAGNOSIS — M62830 Muscle spasm of back: Secondary | ICD-10-CM | POA: Diagnosis not present

## 2015-05-28 DIAGNOSIS — S82142D Displaced bicondylar fracture of left tibia, subsequent encounter for closed fracture with routine healing: Secondary | ICD-10-CM | POA: Diagnosis not present

## 2015-05-30 DIAGNOSIS — S82142D Displaced bicondylar fracture of left tibia, subsequent encounter for closed fracture with routine healing: Secondary | ICD-10-CM | POA: Diagnosis not present

## 2015-06-04 DIAGNOSIS — M5441 Lumbago with sciatica, right side: Secondary | ICD-10-CM | POA: Diagnosis not present

## 2015-06-04 DIAGNOSIS — M62830 Muscle spasm of back: Secondary | ICD-10-CM | POA: Diagnosis not present

## 2015-06-04 DIAGNOSIS — M9903 Segmental and somatic dysfunction of lumbar region: Secondary | ICD-10-CM | POA: Diagnosis not present

## 2015-06-04 DIAGNOSIS — M5136 Other intervertebral disc degeneration, lumbar region: Secondary | ICD-10-CM | POA: Diagnosis not present

## 2015-06-06 DIAGNOSIS — S82142D Displaced bicondylar fracture of left tibia, subsequent encounter for closed fracture with routine healing: Secondary | ICD-10-CM | POA: Diagnosis not present

## 2015-06-13 DIAGNOSIS — M1712 Unilateral primary osteoarthritis, left knee: Secondary | ICD-10-CM | POA: Diagnosis not present

## 2015-06-18 DIAGNOSIS — M62830 Muscle spasm of back: Secondary | ICD-10-CM | POA: Diagnosis not present

## 2015-06-18 DIAGNOSIS — M5136 Other intervertebral disc degeneration, lumbar region: Secondary | ICD-10-CM | POA: Diagnosis not present

## 2015-06-18 DIAGNOSIS — M9903 Segmental and somatic dysfunction of lumbar region: Secondary | ICD-10-CM | POA: Diagnosis not present

## 2015-06-18 DIAGNOSIS — S82142D Displaced bicondylar fracture of left tibia, subsequent encounter for closed fracture with routine healing: Secondary | ICD-10-CM | POA: Diagnosis not present

## 2015-06-18 DIAGNOSIS — M5441 Lumbago with sciatica, right side: Secondary | ICD-10-CM | POA: Diagnosis not present

## 2015-06-20 DIAGNOSIS — S82142D Displaced bicondylar fracture of left tibia, subsequent encounter for closed fracture with routine healing: Secondary | ICD-10-CM | POA: Diagnosis not present

## 2015-06-20 DIAGNOSIS — Z1231 Encounter for screening mammogram for malignant neoplasm of breast: Secondary | ICD-10-CM | POA: Diagnosis not present

## 2015-06-25 DIAGNOSIS — S82142D Displaced bicondylar fracture of left tibia, subsequent encounter for closed fracture with routine healing: Secondary | ICD-10-CM | POA: Diagnosis not present

## 2015-06-27 DIAGNOSIS — S82142D Displaced bicondylar fracture of left tibia, subsequent encounter for closed fracture with routine healing: Secondary | ICD-10-CM | POA: Diagnosis not present

## 2015-07-02 DIAGNOSIS — M9903 Segmental and somatic dysfunction of lumbar region: Secondary | ICD-10-CM | POA: Diagnosis not present

## 2015-07-02 DIAGNOSIS — M62830 Muscle spasm of back: Secondary | ICD-10-CM | POA: Diagnosis not present

## 2015-07-02 DIAGNOSIS — M5441 Lumbago with sciatica, right side: Secondary | ICD-10-CM | POA: Diagnosis not present

## 2015-07-02 DIAGNOSIS — S82142D Displaced bicondylar fracture of left tibia, subsequent encounter for closed fracture with routine healing: Secondary | ICD-10-CM | POA: Diagnosis not present

## 2015-07-02 DIAGNOSIS — M5136 Other intervertebral disc degeneration, lumbar region: Secondary | ICD-10-CM | POA: Diagnosis not present

## 2015-07-09 DIAGNOSIS — S82142D Displaced bicondylar fracture of left tibia, subsequent encounter for closed fracture with routine healing: Secondary | ICD-10-CM | POA: Diagnosis not present

## 2015-07-10 DIAGNOSIS — M19071 Primary osteoarthritis, right ankle and foot: Secondary | ICD-10-CM | POA: Diagnosis not present

## 2015-07-16 DIAGNOSIS — M5441 Lumbago with sciatica, right side: Secondary | ICD-10-CM | POA: Diagnosis not present

## 2015-07-16 DIAGNOSIS — S82142D Displaced bicondylar fracture of left tibia, subsequent encounter for closed fracture with routine healing: Secondary | ICD-10-CM | POA: Diagnosis not present

## 2015-07-16 DIAGNOSIS — M9903 Segmental and somatic dysfunction of lumbar region: Secondary | ICD-10-CM | POA: Diagnosis not present

## 2015-07-16 DIAGNOSIS — M62830 Muscle spasm of back: Secondary | ICD-10-CM | POA: Diagnosis not present

## 2015-07-16 DIAGNOSIS — M5136 Other intervertebral disc degeneration, lumbar region: Secondary | ICD-10-CM | POA: Diagnosis not present

## 2015-08-06 DIAGNOSIS — M5441 Lumbago with sciatica, right side: Secondary | ICD-10-CM | POA: Diagnosis not present

## 2015-08-06 DIAGNOSIS — M62830 Muscle spasm of back: Secondary | ICD-10-CM | POA: Diagnosis not present

## 2015-08-06 DIAGNOSIS — M9903 Segmental and somatic dysfunction of lumbar region: Secondary | ICD-10-CM | POA: Diagnosis not present

## 2015-08-06 DIAGNOSIS — M5136 Other intervertebral disc degeneration, lumbar region: Secondary | ICD-10-CM | POA: Diagnosis not present

## 2015-08-20 DIAGNOSIS — M62830 Muscle spasm of back: Secondary | ICD-10-CM | POA: Diagnosis not present

## 2015-08-20 DIAGNOSIS — M5136 Other intervertebral disc degeneration, lumbar region: Secondary | ICD-10-CM | POA: Diagnosis not present

## 2015-08-20 DIAGNOSIS — M5441 Lumbago with sciatica, right side: Secondary | ICD-10-CM | POA: Diagnosis not present

## 2015-08-20 DIAGNOSIS — M9903 Segmental and somatic dysfunction of lumbar region: Secondary | ICD-10-CM | POA: Diagnosis not present

## 2015-09-03 DIAGNOSIS — M9903 Segmental and somatic dysfunction of lumbar region: Secondary | ICD-10-CM | POA: Diagnosis not present

## 2015-09-03 DIAGNOSIS — M5441 Lumbago with sciatica, right side: Secondary | ICD-10-CM | POA: Diagnosis not present

## 2015-09-03 DIAGNOSIS — M5136 Other intervertebral disc degeneration, lumbar region: Secondary | ICD-10-CM | POA: Diagnosis not present

## 2015-09-03 DIAGNOSIS — M62830 Muscle spasm of back: Secondary | ICD-10-CM | POA: Diagnosis not present

## 2015-09-10 DIAGNOSIS — M84462D Pathological fracture, left tibia, subsequent encounter for fracture with routine healing: Secondary | ICD-10-CM | POA: Diagnosis not present

## 2015-09-10 DIAGNOSIS — S82142D Displaced bicondylar fracture of left tibia, subsequent encounter for closed fracture with routine healing: Secondary | ICD-10-CM | POA: Diagnosis not present

## 2015-09-10 DIAGNOSIS — M1712 Unilateral primary osteoarthritis, left knee: Secondary | ICD-10-CM | POA: Diagnosis not present

## 2015-09-10 DIAGNOSIS — M25562 Pain in left knee: Secondary | ICD-10-CM | POA: Diagnosis not present

## 2015-09-19 DIAGNOSIS — Z85828 Personal history of other malignant neoplasm of skin: Secondary | ICD-10-CM | POA: Diagnosis not present

## 2015-09-19 DIAGNOSIS — M62830 Muscle spasm of back: Secondary | ICD-10-CM | POA: Diagnosis not present

## 2015-09-19 DIAGNOSIS — D225 Melanocytic nevi of trunk: Secondary | ICD-10-CM | POA: Diagnosis not present

## 2015-09-19 DIAGNOSIS — L570 Actinic keratosis: Secondary | ICD-10-CM | POA: Diagnosis not present

## 2015-09-19 DIAGNOSIS — L821 Other seborrheic keratosis: Secondary | ICD-10-CM | POA: Diagnosis not present

## 2015-09-19 DIAGNOSIS — M5136 Other intervertebral disc degeneration, lumbar region: Secondary | ICD-10-CM | POA: Diagnosis not present

## 2015-09-19 DIAGNOSIS — M9903 Segmental and somatic dysfunction of lumbar region: Secondary | ICD-10-CM | POA: Diagnosis not present

## 2015-09-19 DIAGNOSIS — L814 Other melanin hyperpigmentation: Secondary | ICD-10-CM | POA: Diagnosis not present

## 2015-09-19 DIAGNOSIS — M5441 Lumbago with sciatica, right side: Secondary | ICD-10-CM | POA: Diagnosis not present

## 2015-09-22 DIAGNOSIS — M19071 Primary osteoarthritis, right ankle and foot: Secondary | ICD-10-CM | POA: Diagnosis not present

## 2015-10-03 DIAGNOSIS — M9903 Segmental and somatic dysfunction of lumbar region: Secondary | ICD-10-CM | POA: Diagnosis not present

## 2015-10-03 DIAGNOSIS — M5441 Lumbago with sciatica, right side: Secondary | ICD-10-CM | POA: Diagnosis not present

## 2015-10-03 DIAGNOSIS — M5136 Other intervertebral disc degeneration, lumbar region: Secondary | ICD-10-CM | POA: Diagnosis not present

## 2015-10-03 DIAGNOSIS — M62830 Muscle spasm of back: Secondary | ICD-10-CM | POA: Diagnosis not present

## 2015-10-17 DIAGNOSIS — M5136 Other intervertebral disc degeneration, lumbar region: Secondary | ICD-10-CM | POA: Diagnosis not present

## 2015-10-17 DIAGNOSIS — M5441 Lumbago with sciatica, right side: Secondary | ICD-10-CM | POA: Diagnosis not present

## 2015-10-17 DIAGNOSIS — M9903 Segmental and somatic dysfunction of lumbar region: Secondary | ICD-10-CM | POA: Diagnosis not present

## 2015-10-17 DIAGNOSIS — M62830 Muscle spasm of back: Secondary | ICD-10-CM | POA: Diagnosis not present

## 2015-11-12 DIAGNOSIS — E7800 Pure hypercholesterolemia, unspecified: Secondary | ICD-10-CM | POA: Diagnosis not present

## 2015-11-12 DIAGNOSIS — M5136 Other intervertebral disc degeneration, lumbar region: Secondary | ICD-10-CM | POA: Diagnosis not present

## 2015-11-12 DIAGNOSIS — M62830 Muscle spasm of back: Secondary | ICD-10-CM | POA: Diagnosis not present

## 2015-11-12 DIAGNOSIS — M5441 Lumbago with sciatica, right side: Secondary | ICD-10-CM | POA: Diagnosis not present

## 2015-11-12 DIAGNOSIS — N183 Chronic kidney disease, stage 3 (moderate): Secondary | ICD-10-CM | POA: Diagnosis not present

## 2015-11-12 DIAGNOSIS — M1990 Unspecified osteoarthritis, unspecified site: Secondary | ICD-10-CM | POA: Diagnosis not present

## 2015-11-12 DIAGNOSIS — M9903 Segmental and somatic dysfunction of lumbar region: Secondary | ICD-10-CM | POA: Diagnosis not present

## 2015-11-12 DIAGNOSIS — F17211 Nicotine dependence, cigarettes, in remission: Secondary | ICD-10-CM | POA: Diagnosis not present

## 2015-11-12 DIAGNOSIS — I10 Essential (primary) hypertension: Secondary | ICD-10-CM | POA: Diagnosis not present

## 2015-12-10 DIAGNOSIS — M9903 Segmental and somatic dysfunction of lumbar region: Secondary | ICD-10-CM | POA: Diagnosis not present

## 2015-12-10 DIAGNOSIS — M1712 Unilateral primary osteoarthritis, left knee: Secondary | ICD-10-CM | POA: Diagnosis not present

## 2015-12-10 DIAGNOSIS — M5136 Other intervertebral disc degeneration, lumbar region: Secondary | ICD-10-CM | POA: Diagnosis not present

## 2015-12-10 DIAGNOSIS — M6281 Muscle weakness (generalized): Secondary | ICD-10-CM | POA: Diagnosis not present

## 2015-12-10 DIAGNOSIS — M62830 Muscle spasm of back: Secondary | ICD-10-CM | POA: Diagnosis not present

## 2015-12-10 DIAGNOSIS — M5441 Lumbago with sciatica, right side: Secondary | ICD-10-CM | POA: Diagnosis not present

## 2015-12-10 DIAGNOSIS — H524 Presbyopia: Secondary | ICD-10-CM | POA: Diagnosis not present

## 2015-12-24 DIAGNOSIS — M5441 Lumbago with sciatica, right side: Secondary | ICD-10-CM | POA: Diagnosis not present

## 2015-12-24 DIAGNOSIS — M62830 Muscle spasm of back: Secondary | ICD-10-CM | POA: Diagnosis not present

## 2015-12-24 DIAGNOSIS — M9903 Segmental and somatic dysfunction of lumbar region: Secondary | ICD-10-CM | POA: Diagnosis not present

## 2015-12-24 DIAGNOSIS — M5136 Other intervertebral disc degeneration, lumbar region: Secondary | ICD-10-CM | POA: Diagnosis not present

## 2015-12-26 DIAGNOSIS — M79671 Pain in right foot: Secondary | ICD-10-CM | POA: Diagnosis not present

## 2015-12-26 DIAGNOSIS — S86311A Strain of muscle(s) and tendon(s) of peroneal muscle group at lower leg level, right leg, initial encounter: Secondary | ICD-10-CM | POA: Diagnosis not present

## 2016-01-07 DIAGNOSIS — M9903 Segmental and somatic dysfunction of lumbar region: Secondary | ICD-10-CM | POA: Diagnosis not present

## 2016-01-07 DIAGNOSIS — M62830 Muscle spasm of back: Secondary | ICD-10-CM | POA: Diagnosis not present

## 2016-01-07 DIAGNOSIS — M5441 Lumbago with sciatica, right side: Secondary | ICD-10-CM | POA: Diagnosis not present

## 2016-01-07 DIAGNOSIS — M5136 Other intervertebral disc degeneration, lumbar region: Secondary | ICD-10-CM | POA: Diagnosis not present

## 2016-02-11 DIAGNOSIS — M5136 Other intervertebral disc degeneration, lumbar region: Secondary | ICD-10-CM | POA: Diagnosis not present

## 2016-02-11 DIAGNOSIS — M5441 Lumbago with sciatica, right side: Secondary | ICD-10-CM | POA: Diagnosis not present

## 2016-02-11 DIAGNOSIS — M9903 Segmental and somatic dysfunction of lumbar region: Secondary | ICD-10-CM | POA: Diagnosis not present

## 2016-02-11 DIAGNOSIS — M62830 Muscle spasm of back: Secondary | ICD-10-CM | POA: Diagnosis not present

## 2016-02-20 DIAGNOSIS — M1990 Unspecified osteoarthritis, unspecified site: Secondary | ICD-10-CM | POA: Diagnosis not present

## 2016-02-20 DIAGNOSIS — R195 Other fecal abnormalities: Secondary | ICD-10-CM | POA: Diagnosis not present

## 2016-02-25 DIAGNOSIS — M5441 Lumbago with sciatica, right side: Secondary | ICD-10-CM | POA: Diagnosis not present

## 2016-02-25 DIAGNOSIS — M9903 Segmental and somatic dysfunction of lumbar region: Secondary | ICD-10-CM | POA: Diagnosis not present

## 2016-02-25 DIAGNOSIS — M62830 Muscle spasm of back: Secondary | ICD-10-CM | POA: Diagnosis not present

## 2016-02-25 DIAGNOSIS — M5136 Other intervertebral disc degeneration, lumbar region: Secondary | ICD-10-CM | POA: Diagnosis not present

## 2016-03-10 DIAGNOSIS — M9903 Segmental and somatic dysfunction of lumbar region: Secondary | ICD-10-CM | POA: Diagnosis not present

## 2016-03-10 DIAGNOSIS — M5441 Lumbago with sciatica, right side: Secondary | ICD-10-CM | POA: Diagnosis not present

## 2016-03-10 DIAGNOSIS — M5136 Other intervertebral disc degeneration, lumbar region: Secondary | ICD-10-CM | POA: Diagnosis not present

## 2016-03-10 DIAGNOSIS — M62830 Muscle spasm of back: Secondary | ICD-10-CM | POA: Diagnosis not present

## 2016-03-31 DIAGNOSIS — M5441 Lumbago with sciatica, right side: Secondary | ICD-10-CM | POA: Diagnosis not present

## 2016-03-31 DIAGNOSIS — M9903 Segmental and somatic dysfunction of lumbar region: Secondary | ICD-10-CM | POA: Diagnosis not present

## 2016-03-31 DIAGNOSIS — M5136 Other intervertebral disc degeneration, lumbar region: Secondary | ICD-10-CM | POA: Diagnosis not present

## 2016-03-31 DIAGNOSIS — M62830 Muscle spasm of back: Secondary | ICD-10-CM | POA: Diagnosis not present

## 2016-04-07 DIAGNOSIS — M5136 Other intervertebral disc degeneration, lumbar region: Secondary | ICD-10-CM | POA: Diagnosis not present

## 2016-04-07 DIAGNOSIS — M5441 Lumbago with sciatica, right side: Secondary | ICD-10-CM | POA: Diagnosis not present

## 2016-04-07 DIAGNOSIS — M9903 Segmental and somatic dysfunction of lumbar region: Secondary | ICD-10-CM | POA: Diagnosis not present

## 2016-04-07 DIAGNOSIS — M62830 Muscle spasm of back: Secondary | ICD-10-CM | POA: Diagnosis not present

## 2016-05-12 DIAGNOSIS — M9903 Segmental and somatic dysfunction of lumbar region: Secondary | ICD-10-CM | POA: Diagnosis not present

## 2016-05-12 DIAGNOSIS — M62830 Muscle spasm of back: Secondary | ICD-10-CM | POA: Diagnosis not present

## 2016-05-12 DIAGNOSIS — M5441 Lumbago with sciatica, right side: Secondary | ICD-10-CM | POA: Diagnosis not present

## 2016-05-12 DIAGNOSIS — M5136 Other intervertebral disc degeneration, lumbar region: Secondary | ICD-10-CM | POA: Diagnosis not present

## 2016-05-21 DIAGNOSIS — J111 Influenza due to unidentified influenza virus with other respiratory manifestations: Secondary | ICD-10-CM | POA: Diagnosis not present

## 2016-05-26 DIAGNOSIS — M5441 Lumbago with sciatica, right side: Secondary | ICD-10-CM | POA: Diagnosis not present

## 2016-05-26 DIAGNOSIS — M62830 Muscle spasm of back: Secondary | ICD-10-CM | POA: Diagnosis not present

## 2016-05-26 DIAGNOSIS — M9903 Segmental and somatic dysfunction of lumbar region: Secondary | ICD-10-CM | POA: Diagnosis not present

## 2016-05-26 DIAGNOSIS — M5136 Other intervertebral disc degeneration, lumbar region: Secondary | ICD-10-CM | POA: Diagnosis not present

## 2016-06-09 DIAGNOSIS — M9903 Segmental and somatic dysfunction of lumbar region: Secondary | ICD-10-CM | POA: Diagnosis not present

## 2016-06-09 DIAGNOSIS — M5441 Lumbago with sciatica, right side: Secondary | ICD-10-CM | POA: Diagnosis not present

## 2016-06-09 DIAGNOSIS — M5136 Other intervertebral disc degeneration, lumbar region: Secondary | ICD-10-CM | POA: Diagnosis not present

## 2016-06-09 DIAGNOSIS — M62830 Muscle spasm of back: Secondary | ICD-10-CM | POA: Diagnosis not present

## 2016-06-14 DIAGNOSIS — S63502A Unspecified sprain of left wrist, initial encounter: Secondary | ICD-10-CM | POA: Diagnosis not present

## 2016-06-23 DIAGNOSIS — M9903 Segmental and somatic dysfunction of lumbar region: Secondary | ICD-10-CM | POA: Diagnosis not present

## 2016-06-23 DIAGNOSIS — M5136 Other intervertebral disc degeneration, lumbar region: Secondary | ICD-10-CM | POA: Diagnosis not present

## 2016-06-23 DIAGNOSIS — M62830 Muscle spasm of back: Secondary | ICD-10-CM | POA: Diagnosis not present

## 2016-06-23 DIAGNOSIS — M5441 Lumbago with sciatica, right side: Secondary | ICD-10-CM | POA: Diagnosis not present

## 2016-06-25 DIAGNOSIS — H01114 Allergic dermatitis of left upper eyelid: Secondary | ICD-10-CM | POA: Diagnosis not present

## 2016-06-30 DIAGNOSIS — M5136 Other intervertebral disc degeneration, lumbar region: Secondary | ICD-10-CM | POA: Diagnosis not present

## 2016-06-30 DIAGNOSIS — M5441 Lumbago with sciatica, right side: Secondary | ICD-10-CM | POA: Diagnosis not present

## 2016-06-30 DIAGNOSIS — M62830 Muscle spasm of back: Secondary | ICD-10-CM | POA: Diagnosis not present

## 2016-06-30 DIAGNOSIS — M9903 Segmental and somatic dysfunction of lumbar region: Secondary | ICD-10-CM | POA: Diagnosis not present

## 2016-07-08 DIAGNOSIS — M19071 Primary osteoarthritis, right ankle and foot: Secondary | ICD-10-CM | POA: Diagnosis not present

## 2016-07-14 DIAGNOSIS — M62830 Muscle spasm of back: Secondary | ICD-10-CM | POA: Diagnosis not present

## 2016-07-14 DIAGNOSIS — M9903 Segmental and somatic dysfunction of lumbar region: Secondary | ICD-10-CM | POA: Diagnosis not present

## 2016-07-14 DIAGNOSIS — M5136 Other intervertebral disc degeneration, lumbar region: Secondary | ICD-10-CM | POA: Diagnosis not present

## 2016-07-14 DIAGNOSIS — M5441 Lumbago with sciatica, right side: Secondary | ICD-10-CM | POA: Diagnosis not present

## 2016-07-28 DIAGNOSIS — M9903 Segmental and somatic dysfunction of lumbar region: Secondary | ICD-10-CM | POA: Diagnosis not present

## 2016-07-28 DIAGNOSIS — M5136 Other intervertebral disc degeneration, lumbar region: Secondary | ICD-10-CM | POA: Diagnosis not present

## 2016-07-28 DIAGNOSIS — M62830 Muscle spasm of back: Secondary | ICD-10-CM | POA: Diagnosis not present

## 2016-07-28 DIAGNOSIS — M5441 Lumbago with sciatica, right side: Secondary | ICD-10-CM | POA: Diagnosis not present

## 2016-08-13 DIAGNOSIS — Z8262 Family history of osteoporosis: Secondary | ICD-10-CM | POA: Diagnosis not present

## 2016-08-13 DIAGNOSIS — M8589 Other specified disorders of bone density and structure, multiple sites: Secondary | ICD-10-CM | POA: Diagnosis not present

## 2016-08-13 DIAGNOSIS — Z1231 Encounter for screening mammogram for malignant neoplasm of breast: Secondary | ICD-10-CM | POA: Diagnosis not present

## 2016-08-13 DIAGNOSIS — Z803 Family history of malignant neoplasm of breast: Secondary | ICD-10-CM | POA: Diagnosis not present

## 2016-08-18 DIAGNOSIS — M62830 Muscle spasm of back: Secondary | ICD-10-CM | POA: Diagnosis not present

## 2016-08-18 DIAGNOSIS — M5441 Lumbago with sciatica, right side: Secondary | ICD-10-CM | POA: Diagnosis not present

## 2016-08-18 DIAGNOSIS — M9903 Segmental and somatic dysfunction of lumbar region: Secondary | ICD-10-CM | POA: Diagnosis not present

## 2016-08-18 DIAGNOSIS — M5136 Other intervertebral disc degeneration, lumbar region: Secondary | ICD-10-CM | POA: Diagnosis not present

## 2016-09-03 DIAGNOSIS — M109 Gout, unspecified: Secondary | ICD-10-CM | POA: Diagnosis not present

## 2016-09-03 DIAGNOSIS — Z1159 Encounter for screening for other viral diseases: Secondary | ICD-10-CM | POA: Diagnosis not present

## 2016-09-03 DIAGNOSIS — Z79899 Other long term (current) drug therapy: Secondary | ICD-10-CM | POA: Diagnosis not present

## 2016-09-03 DIAGNOSIS — E7800 Pure hypercholesterolemia, unspecified: Secondary | ICD-10-CM | POA: Diagnosis not present

## 2016-09-08 DIAGNOSIS — M5441 Lumbago with sciatica, right side: Secondary | ICD-10-CM | POA: Diagnosis not present

## 2016-09-08 DIAGNOSIS — M5136 Other intervertebral disc degeneration, lumbar region: Secondary | ICD-10-CM | POA: Diagnosis not present

## 2016-09-08 DIAGNOSIS — M9903 Segmental and somatic dysfunction of lumbar region: Secondary | ICD-10-CM | POA: Diagnosis not present

## 2016-09-08 DIAGNOSIS — M62830 Muscle spasm of back: Secondary | ICD-10-CM | POA: Diagnosis not present

## 2016-09-10 DIAGNOSIS — F17211 Nicotine dependence, cigarettes, in remission: Secondary | ICD-10-CM | POA: Diagnosis not present

## 2016-09-10 DIAGNOSIS — Z6834 Body mass index (BMI) 34.0-34.9, adult: Secondary | ICD-10-CM | POA: Diagnosis not present

## 2016-09-10 DIAGNOSIS — M1990 Unspecified osteoarthritis, unspecified site: Secondary | ICD-10-CM | POA: Diagnosis not present

## 2016-09-10 DIAGNOSIS — E7800 Pure hypercholesterolemia, unspecified: Secondary | ICD-10-CM | POA: Diagnosis not present

## 2016-09-10 DIAGNOSIS — R5383 Other fatigue: Secondary | ICD-10-CM | POA: Diagnosis not present

## 2016-09-10 DIAGNOSIS — K635 Polyp of colon: Secondary | ICD-10-CM | POA: Diagnosis not present

## 2016-09-10 DIAGNOSIS — M859 Disorder of bone density and structure, unspecified: Secondary | ICD-10-CM | POA: Diagnosis not present

## 2016-09-10 DIAGNOSIS — N183 Chronic kidney disease, stage 3 (moderate): Secondary | ICD-10-CM | POA: Diagnosis not present

## 2016-09-10 DIAGNOSIS — Z0000 Encounter for general adult medical examination without abnormal findings: Secondary | ICD-10-CM | POA: Diagnosis not present

## 2016-09-10 DIAGNOSIS — E6609 Other obesity due to excess calories: Secondary | ICD-10-CM | POA: Diagnosis not present

## 2016-09-10 DIAGNOSIS — I10 Essential (primary) hypertension: Secondary | ICD-10-CM | POA: Diagnosis not present

## 2016-09-24 DIAGNOSIS — L814 Other melanin hyperpigmentation: Secondary | ICD-10-CM | POA: Diagnosis not present

## 2016-09-24 DIAGNOSIS — D692 Other nonthrombocytopenic purpura: Secondary | ICD-10-CM | POA: Diagnosis not present

## 2016-09-24 DIAGNOSIS — L720 Epidermal cyst: Secondary | ICD-10-CM | POA: Diagnosis not present

## 2016-09-24 DIAGNOSIS — L700 Acne vulgaris: Secondary | ICD-10-CM | POA: Diagnosis not present

## 2016-09-24 DIAGNOSIS — D1801 Hemangioma of skin and subcutaneous tissue: Secondary | ICD-10-CM | POA: Diagnosis not present

## 2016-09-24 DIAGNOSIS — D485 Neoplasm of uncertain behavior of skin: Secondary | ICD-10-CM | POA: Diagnosis not present

## 2016-09-24 DIAGNOSIS — L821 Other seborrheic keratosis: Secondary | ICD-10-CM | POA: Diagnosis not present

## 2016-09-24 DIAGNOSIS — Z85828 Personal history of other malignant neoplasm of skin: Secondary | ICD-10-CM | POA: Diagnosis not present

## 2016-09-24 DIAGNOSIS — L918 Other hypertrophic disorders of the skin: Secondary | ICD-10-CM | POA: Diagnosis not present

## 2016-09-24 DIAGNOSIS — D2272 Melanocytic nevi of left lower limb, including hip: Secondary | ICD-10-CM | POA: Diagnosis not present

## 2016-09-24 DIAGNOSIS — L570 Actinic keratosis: Secondary | ICD-10-CM | POA: Diagnosis not present

## 2016-09-24 DIAGNOSIS — L738 Other specified follicular disorders: Secondary | ICD-10-CM | POA: Diagnosis not present

## 2016-09-24 DIAGNOSIS — D2262 Melanocytic nevi of left upper limb, including shoulder: Secondary | ICD-10-CM | POA: Diagnosis not present

## 2016-09-27 DIAGNOSIS — M5136 Other intervertebral disc degeneration, lumbar region: Secondary | ICD-10-CM | POA: Diagnosis not present

## 2016-09-27 DIAGNOSIS — M5441 Lumbago with sciatica, right side: Secondary | ICD-10-CM | POA: Diagnosis not present

## 2016-09-27 DIAGNOSIS — M9903 Segmental and somatic dysfunction of lumbar region: Secondary | ICD-10-CM | POA: Diagnosis not present

## 2016-09-27 DIAGNOSIS — M62830 Muscle spasm of back: Secondary | ICD-10-CM | POA: Diagnosis not present

## 2016-10-11 DIAGNOSIS — M5441 Lumbago with sciatica, right side: Secondary | ICD-10-CM | POA: Diagnosis not present

## 2016-10-11 DIAGNOSIS — M62830 Muscle spasm of back: Secondary | ICD-10-CM | POA: Diagnosis not present

## 2016-10-11 DIAGNOSIS — M5136 Other intervertebral disc degeneration, lumbar region: Secondary | ICD-10-CM | POA: Diagnosis not present

## 2016-10-11 DIAGNOSIS — G894 Chronic pain syndrome: Secondary | ICD-10-CM | POA: Diagnosis not present

## 2016-10-11 DIAGNOSIS — M9903 Segmental and somatic dysfunction of lumbar region: Secondary | ICD-10-CM | POA: Diagnosis not present

## 2016-10-26 DIAGNOSIS — D485 Neoplasm of uncertain behavior of skin: Secondary | ICD-10-CM | POA: Diagnosis not present

## 2016-10-26 DIAGNOSIS — L988 Other specified disorders of the skin and subcutaneous tissue: Secondary | ICD-10-CM | POA: Diagnosis not present

## 2016-10-26 DIAGNOSIS — Z85828 Personal history of other malignant neoplasm of skin: Secondary | ICD-10-CM | POA: Diagnosis not present

## 2016-11-29 DIAGNOSIS — M9903 Segmental and somatic dysfunction of lumbar region: Secondary | ICD-10-CM | POA: Diagnosis not present

## 2016-11-29 DIAGNOSIS — M62830 Muscle spasm of back: Secondary | ICD-10-CM | POA: Diagnosis not present

## 2016-11-29 DIAGNOSIS — M5136 Other intervertebral disc degeneration, lumbar region: Secondary | ICD-10-CM | POA: Diagnosis not present

## 2016-11-29 DIAGNOSIS — M5441 Lumbago with sciatica, right side: Secondary | ICD-10-CM | POA: Diagnosis not present

## 2016-12-02 DIAGNOSIS — G8929 Other chronic pain: Secondary | ICD-10-CM | POA: Diagnosis not present

## 2016-12-02 DIAGNOSIS — M25562 Pain in left knee: Secondary | ICD-10-CM | POA: Diagnosis not present

## 2016-12-02 DIAGNOSIS — M1712 Unilateral primary osteoarthritis, left knee: Secondary | ICD-10-CM | POA: Diagnosis not present

## 2016-12-15 DIAGNOSIS — M9903 Segmental and somatic dysfunction of lumbar region: Secondary | ICD-10-CM | POA: Diagnosis not present

## 2016-12-15 DIAGNOSIS — M5441 Lumbago with sciatica, right side: Secondary | ICD-10-CM | POA: Diagnosis not present

## 2016-12-15 DIAGNOSIS — M62830 Muscle spasm of back: Secondary | ICD-10-CM | POA: Diagnosis not present

## 2016-12-15 DIAGNOSIS — M19071 Primary osteoarthritis, right ankle and foot: Secondary | ICD-10-CM | POA: Diagnosis not present

## 2016-12-15 DIAGNOSIS — M5136 Other intervertebral disc degeneration, lumbar region: Secondary | ICD-10-CM | POA: Diagnosis not present

## 2016-12-29 DIAGNOSIS — M9903 Segmental and somatic dysfunction of lumbar region: Secondary | ICD-10-CM | POA: Diagnosis not present

## 2016-12-29 DIAGNOSIS — M62830 Muscle spasm of back: Secondary | ICD-10-CM | POA: Diagnosis not present

## 2016-12-29 DIAGNOSIS — M5136 Other intervertebral disc degeneration, lumbar region: Secondary | ICD-10-CM | POA: Diagnosis not present

## 2016-12-29 DIAGNOSIS — M5441 Lumbago with sciatica, right side: Secondary | ICD-10-CM | POA: Diagnosis not present

## 2017-01-12 DIAGNOSIS — M62830 Muscle spasm of back: Secondary | ICD-10-CM | POA: Diagnosis not present

## 2017-01-12 DIAGNOSIS — M5441 Lumbago with sciatica, right side: Secondary | ICD-10-CM | POA: Diagnosis not present

## 2017-01-12 DIAGNOSIS — M9903 Segmental and somatic dysfunction of lumbar region: Secondary | ICD-10-CM | POA: Diagnosis not present

## 2017-01-12 DIAGNOSIS — M5136 Other intervertebral disc degeneration, lumbar region: Secondary | ICD-10-CM | POA: Diagnosis not present

## 2017-02-02 DIAGNOSIS — M62830 Muscle spasm of back: Secondary | ICD-10-CM | POA: Diagnosis not present

## 2017-02-02 DIAGNOSIS — M5441 Lumbago with sciatica, right side: Secondary | ICD-10-CM | POA: Diagnosis not present

## 2017-02-02 DIAGNOSIS — M5136 Other intervertebral disc degeneration, lumbar region: Secondary | ICD-10-CM | POA: Diagnosis not present

## 2017-02-02 DIAGNOSIS — M9903 Segmental and somatic dysfunction of lumbar region: Secondary | ICD-10-CM | POA: Diagnosis not present

## 2017-03-02 DIAGNOSIS — M62830 Muscle spasm of back: Secondary | ICD-10-CM | POA: Diagnosis not present

## 2017-03-02 DIAGNOSIS — M5136 Other intervertebral disc degeneration, lumbar region: Secondary | ICD-10-CM | POA: Diagnosis not present

## 2017-03-02 DIAGNOSIS — M9903 Segmental and somatic dysfunction of lumbar region: Secondary | ICD-10-CM | POA: Diagnosis not present

## 2017-03-02 DIAGNOSIS — M5441 Lumbago with sciatica, right side: Secondary | ICD-10-CM | POA: Diagnosis not present

## 2017-04-29 DIAGNOSIS — M62830 Muscle spasm of back: Secondary | ICD-10-CM | POA: Diagnosis not present

## 2017-04-29 DIAGNOSIS — M9903 Segmental and somatic dysfunction of lumbar region: Secondary | ICD-10-CM | POA: Diagnosis not present

## 2017-04-29 DIAGNOSIS — M5441 Lumbago with sciatica, right side: Secondary | ICD-10-CM | POA: Diagnosis not present

## 2017-04-29 DIAGNOSIS — M5136 Other intervertebral disc degeneration, lumbar region: Secondary | ICD-10-CM | POA: Diagnosis not present

## 2017-05-13 DIAGNOSIS — M109 Gout, unspecified: Secondary | ICD-10-CM | POA: Diagnosis not present

## 2017-05-13 DIAGNOSIS — Z79899 Other long term (current) drug therapy: Secondary | ICD-10-CM | POA: Diagnosis not present

## 2017-05-20 DIAGNOSIS — M9903 Segmental and somatic dysfunction of lumbar region: Secondary | ICD-10-CM | POA: Diagnosis not present

## 2017-05-20 DIAGNOSIS — N183 Chronic kidney disease, stage 3 (moderate): Secondary | ICD-10-CM | POA: Diagnosis not present

## 2017-05-20 DIAGNOSIS — E7800 Pure hypercholesterolemia, unspecified: Secondary | ICD-10-CM | POA: Diagnosis not present

## 2017-05-20 DIAGNOSIS — M5441 Lumbago with sciatica, right side: Secondary | ICD-10-CM | POA: Diagnosis not present

## 2017-05-20 DIAGNOSIS — F17211 Nicotine dependence, cigarettes, in remission: Secondary | ICD-10-CM | POA: Diagnosis not present

## 2017-05-20 DIAGNOSIS — M62830 Muscle spasm of back: Secondary | ICD-10-CM | POA: Diagnosis not present

## 2017-05-20 DIAGNOSIS — G894 Chronic pain syndrome: Secondary | ICD-10-CM | POA: Diagnosis not present

## 2017-05-20 DIAGNOSIS — M1990 Unspecified osteoarthritis, unspecified site: Secondary | ICD-10-CM | POA: Diagnosis not present

## 2017-05-20 DIAGNOSIS — M5136 Other intervertebral disc degeneration, lumbar region: Secondary | ICD-10-CM | POA: Diagnosis not present

## 2017-05-20 DIAGNOSIS — I10 Essential (primary) hypertension: Secondary | ICD-10-CM | POA: Diagnosis not present

## 2017-05-28 DIAGNOSIS — H6692 Otitis media, unspecified, left ear: Secondary | ICD-10-CM | POA: Diagnosis not present

## 2017-05-28 DIAGNOSIS — J069 Acute upper respiratory infection, unspecified: Secondary | ICD-10-CM | POA: Diagnosis not present

## 2017-06-10 DIAGNOSIS — M5441 Lumbago with sciatica, right side: Secondary | ICD-10-CM | POA: Diagnosis not present

## 2017-06-10 DIAGNOSIS — M9903 Segmental and somatic dysfunction of lumbar region: Secondary | ICD-10-CM | POA: Diagnosis not present

## 2017-06-10 DIAGNOSIS — M62830 Muscle spasm of back: Secondary | ICD-10-CM | POA: Diagnosis not present

## 2017-06-10 DIAGNOSIS — M5136 Other intervertebral disc degeneration, lumbar region: Secondary | ICD-10-CM | POA: Diagnosis not present

## 2017-06-24 DIAGNOSIS — M5441 Lumbago with sciatica, right side: Secondary | ICD-10-CM | POA: Diagnosis not present

## 2017-06-24 DIAGNOSIS — M9903 Segmental and somatic dysfunction of lumbar region: Secondary | ICD-10-CM | POA: Diagnosis not present

## 2017-06-24 DIAGNOSIS — M62830 Muscle spasm of back: Secondary | ICD-10-CM | POA: Diagnosis not present

## 2017-06-24 DIAGNOSIS — M5136 Other intervertebral disc degeneration, lumbar region: Secondary | ICD-10-CM | POA: Diagnosis not present

## 2017-07-06 DIAGNOSIS — M62830 Muscle spasm of back: Secondary | ICD-10-CM | POA: Diagnosis not present

## 2017-07-06 DIAGNOSIS — M5441 Lumbago with sciatica, right side: Secondary | ICD-10-CM | POA: Diagnosis not present

## 2017-07-06 DIAGNOSIS — M9903 Segmental and somatic dysfunction of lumbar region: Secondary | ICD-10-CM | POA: Diagnosis not present

## 2017-07-06 DIAGNOSIS — M5136 Other intervertebral disc degeneration, lumbar region: Secondary | ICD-10-CM | POA: Diagnosis not present

## 2017-07-13 DIAGNOSIS — M67862 Other specified disorders of synovium, left knee: Secondary | ICD-10-CM | POA: Diagnosis not present

## 2017-07-13 DIAGNOSIS — M67962 Unspecified disorder of synovium and tendon, left lower leg: Secondary | ICD-10-CM | POA: Diagnosis not present

## 2017-07-13 DIAGNOSIS — M25571 Pain in right ankle and joints of right foot: Secondary | ICD-10-CM | POA: Diagnosis not present

## 2017-07-13 DIAGNOSIS — M25572 Pain in left ankle and joints of left foot: Secondary | ICD-10-CM | POA: Diagnosis not present

## 2017-07-20 DIAGNOSIS — M5136 Other intervertebral disc degeneration, lumbar region: Secondary | ICD-10-CM | POA: Diagnosis not present

## 2017-07-20 DIAGNOSIS — M5441 Lumbago with sciatica, right side: Secondary | ICD-10-CM | POA: Diagnosis not present

## 2017-07-20 DIAGNOSIS — M62830 Muscle spasm of back: Secondary | ICD-10-CM | POA: Diagnosis not present

## 2017-07-20 DIAGNOSIS — M9903 Segmental and somatic dysfunction of lumbar region: Secondary | ICD-10-CM | POA: Diagnosis not present

## 2017-07-23 DIAGNOSIS — J309 Allergic rhinitis, unspecified: Secondary | ICD-10-CM | POA: Diagnosis not present

## 2017-07-27 DIAGNOSIS — M76829 Posterior tibial tendinitis, unspecified leg: Secondary | ICD-10-CM | POA: Insufficient documentation

## 2017-07-27 DIAGNOSIS — M76822 Posterior tibial tendinitis, left leg: Secondary | ICD-10-CM | POA: Diagnosis not present

## 2017-07-29 DIAGNOSIS — M76822 Posterior tibial tendinitis, left leg: Secondary | ICD-10-CM | POA: Diagnosis not present

## 2017-08-03 DIAGNOSIS — M9903 Segmental and somatic dysfunction of lumbar region: Secondary | ICD-10-CM | POA: Diagnosis not present

## 2017-08-03 DIAGNOSIS — M76822 Posterior tibial tendinitis, left leg: Secondary | ICD-10-CM | POA: Diagnosis not present

## 2017-08-03 DIAGNOSIS — M62830 Muscle spasm of back: Secondary | ICD-10-CM | POA: Diagnosis not present

## 2017-08-03 DIAGNOSIS — M5136 Other intervertebral disc degeneration, lumbar region: Secondary | ICD-10-CM | POA: Diagnosis not present

## 2017-08-03 DIAGNOSIS — M5441 Lumbago with sciatica, right side: Secondary | ICD-10-CM | POA: Diagnosis not present

## 2017-08-10 DIAGNOSIS — M9903 Segmental and somatic dysfunction of lumbar region: Secondary | ICD-10-CM | POA: Diagnosis not present

## 2017-08-10 DIAGNOSIS — M62830 Muscle spasm of back: Secondary | ICD-10-CM | POA: Diagnosis not present

## 2017-08-10 DIAGNOSIS — M5136 Other intervertebral disc degeneration, lumbar region: Secondary | ICD-10-CM | POA: Diagnosis not present

## 2017-08-10 DIAGNOSIS — M5441 Lumbago with sciatica, right side: Secondary | ICD-10-CM | POA: Diagnosis not present

## 2017-08-10 DIAGNOSIS — M76822 Posterior tibial tendinitis, left leg: Secondary | ICD-10-CM | POA: Diagnosis not present

## 2017-08-17 DIAGNOSIS — M76822 Posterior tibial tendinitis, left leg: Secondary | ICD-10-CM | POA: Diagnosis not present

## 2017-08-23 DIAGNOSIS — M5136 Other intervertebral disc degeneration, lumbar region: Secondary | ICD-10-CM | POA: Diagnosis not present

## 2017-08-23 DIAGNOSIS — M5441 Lumbago with sciatica, right side: Secondary | ICD-10-CM | POA: Diagnosis not present

## 2017-08-23 DIAGNOSIS — M9903 Segmental and somatic dysfunction of lumbar region: Secondary | ICD-10-CM | POA: Diagnosis not present

## 2017-08-23 DIAGNOSIS — M62830 Muscle spasm of back: Secondary | ICD-10-CM | POA: Diagnosis not present

## 2017-08-24 DIAGNOSIS — L700 Acne vulgaris: Secondary | ICD-10-CM | POA: Diagnosis not present

## 2017-08-24 DIAGNOSIS — Z85828 Personal history of other malignant neoplasm of skin: Secondary | ICD-10-CM | POA: Diagnosis not present

## 2017-08-24 DIAGNOSIS — L570 Actinic keratosis: Secondary | ICD-10-CM | POA: Diagnosis not present

## 2017-08-24 DIAGNOSIS — D2262 Melanocytic nevi of left upper limb, including shoulder: Secondary | ICD-10-CM | POA: Diagnosis not present

## 2017-08-24 DIAGNOSIS — D1801 Hemangioma of skin and subcutaneous tissue: Secondary | ICD-10-CM | POA: Diagnosis not present

## 2017-08-24 DIAGNOSIS — L918 Other hypertrophic disorders of the skin: Secondary | ICD-10-CM | POA: Diagnosis not present

## 2017-08-24 DIAGNOSIS — L821 Other seborrheic keratosis: Secondary | ICD-10-CM | POA: Diagnosis not present

## 2017-08-24 DIAGNOSIS — D225 Melanocytic nevi of trunk: Secondary | ICD-10-CM | POA: Diagnosis not present

## 2017-08-24 DIAGNOSIS — L814 Other melanin hyperpigmentation: Secondary | ICD-10-CM | POA: Diagnosis not present

## 2017-08-26 DIAGNOSIS — M76822 Posterior tibial tendinitis, left leg: Secondary | ICD-10-CM | POA: Diagnosis not present

## 2017-09-19 DIAGNOSIS — M9901 Segmental and somatic dysfunction of cervical region: Secondary | ICD-10-CM | POA: Diagnosis not present

## 2017-09-19 DIAGNOSIS — M5136 Other intervertebral disc degeneration, lumbar region: Secondary | ICD-10-CM | POA: Diagnosis not present

## 2017-09-19 DIAGNOSIS — M5441 Lumbago with sciatica, right side: Secondary | ICD-10-CM | POA: Diagnosis not present

## 2017-09-19 DIAGNOSIS — M542 Cervicalgia: Secondary | ICD-10-CM | POA: Diagnosis not present

## 2017-09-19 DIAGNOSIS — M4712 Other spondylosis with myelopathy, cervical region: Secondary | ICD-10-CM | POA: Diagnosis not present

## 2017-09-19 DIAGNOSIS — M62830 Muscle spasm of back: Secondary | ICD-10-CM | POA: Diagnosis not present

## 2017-09-19 DIAGNOSIS — M9903 Segmental and somatic dysfunction of lumbar region: Secondary | ICD-10-CM | POA: Diagnosis not present

## 2017-09-24 DIAGNOSIS — M25572 Pain in left ankle and joints of left foot: Secondary | ICD-10-CM | POA: Diagnosis not present

## 2017-10-17 DIAGNOSIS — M9901 Segmental and somatic dysfunction of cervical region: Secondary | ICD-10-CM | POA: Diagnosis not present

## 2017-10-17 DIAGNOSIS — M9903 Segmental and somatic dysfunction of lumbar region: Secondary | ICD-10-CM | POA: Diagnosis not present

## 2017-10-17 DIAGNOSIS — M5441 Lumbago with sciatica, right side: Secondary | ICD-10-CM | POA: Diagnosis not present

## 2017-10-17 DIAGNOSIS — M62830 Muscle spasm of back: Secondary | ICD-10-CM | POA: Diagnosis not present

## 2017-10-17 DIAGNOSIS — M4712 Other spondylosis with myelopathy, cervical region: Secondary | ICD-10-CM | POA: Diagnosis not present

## 2017-10-17 DIAGNOSIS — M5136 Other intervertebral disc degeneration, lumbar region: Secondary | ICD-10-CM | POA: Diagnosis not present

## 2017-10-17 DIAGNOSIS — M542 Cervicalgia: Secondary | ICD-10-CM | POA: Diagnosis not present

## 2017-10-18 DIAGNOSIS — M25572 Pain in left ankle and joints of left foot: Secondary | ICD-10-CM | POA: Diagnosis not present

## 2017-10-18 DIAGNOSIS — M76829 Posterior tibial tendinitis, unspecified leg: Secondary | ICD-10-CM | POA: Diagnosis not present

## 2017-11-07 DIAGNOSIS — M5441 Lumbago with sciatica, right side: Secondary | ICD-10-CM | POA: Diagnosis not present

## 2017-11-07 DIAGNOSIS — M5136 Other intervertebral disc degeneration, lumbar region: Secondary | ICD-10-CM | POA: Diagnosis not present

## 2017-11-07 DIAGNOSIS — M9903 Segmental and somatic dysfunction of lumbar region: Secondary | ICD-10-CM | POA: Diagnosis not present

## 2017-11-07 DIAGNOSIS — M542 Cervicalgia: Secondary | ICD-10-CM | POA: Diagnosis not present

## 2017-11-07 DIAGNOSIS — M4712 Other spondylosis with myelopathy, cervical region: Secondary | ICD-10-CM | POA: Diagnosis not present

## 2017-11-07 DIAGNOSIS — M62830 Muscle spasm of back: Secondary | ICD-10-CM | POA: Diagnosis not present

## 2017-11-07 DIAGNOSIS — M9901 Segmental and somatic dysfunction of cervical region: Secondary | ICD-10-CM | POA: Diagnosis not present

## 2017-11-09 DIAGNOSIS — Z803 Family history of malignant neoplasm of breast: Secondary | ICD-10-CM | POA: Diagnosis not present

## 2017-11-09 DIAGNOSIS — Z1231 Encounter for screening mammogram for malignant neoplasm of breast: Secondary | ICD-10-CM | POA: Diagnosis not present

## 2017-11-22 DIAGNOSIS — M9903 Segmental and somatic dysfunction of lumbar region: Secondary | ICD-10-CM | POA: Diagnosis not present

## 2017-11-22 DIAGNOSIS — M4712 Other spondylosis with myelopathy, cervical region: Secondary | ICD-10-CM | POA: Diagnosis not present

## 2017-11-22 DIAGNOSIS — M5136 Other intervertebral disc degeneration, lumbar region: Secondary | ICD-10-CM | POA: Diagnosis not present

## 2017-11-22 DIAGNOSIS — M542 Cervicalgia: Secondary | ICD-10-CM | POA: Diagnosis not present

## 2017-11-22 DIAGNOSIS — M62830 Muscle spasm of back: Secondary | ICD-10-CM | POA: Diagnosis not present

## 2017-11-22 DIAGNOSIS — M9901 Segmental and somatic dysfunction of cervical region: Secondary | ICD-10-CM | POA: Diagnosis not present

## 2017-11-22 DIAGNOSIS — M5441 Lumbago with sciatica, right side: Secondary | ICD-10-CM | POA: Diagnosis not present

## 2017-11-25 DIAGNOSIS — M76822 Posterior tibial tendinitis, left leg: Secondary | ICD-10-CM | POA: Diagnosis not present

## 2017-11-25 DIAGNOSIS — M25572 Pain in left ankle and joints of left foot: Secondary | ICD-10-CM | POA: Diagnosis not present

## 2017-11-29 DIAGNOSIS — M1712 Unilateral primary osteoarthritis, left knee: Secondary | ICD-10-CM | POA: Diagnosis not present

## 2017-12-01 DIAGNOSIS — M48061 Spinal stenosis, lumbar region without neurogenic claudication: Secondary | ICD-10-CM | POA: Diagnosis not present

## 2017-12-01 DIAGNOSIS — M545 Low back pain: Secondary | ICD-10-CM | POA: Diagnosis not present

## 2017-12-01 DIAGNOSIS — M5136 Other intervertebral disc degeneration, lumbar region: Secondary | ICD-10-CM | POA: Diagnosis not present

## 2017-12-01 DIAGNOSIS — M419 Scoliosis, unspecified: Secondary | ICD-10-CM | POA: Insufficient documentation

## 2017-12-02 DIAGNOSIS — F17211 Nicotine dependence, cigarettes, in remission: Secondary | ICD-10-CM | POA: Diagnosis not present

## 2017-12-02 DIAGNOSIS — I10 Essential (primary) hypertension: Secondary | ICD-10-CM | POA: Diagnosis not present

## 2017-12-02 DIAGNOSIS — Z0000 Encounter for general adult medical examination without abnormal findings: Secondary | ICD-10-CM | POA: Diagnosis not present

## 2017-12-02 DIAGNOSIS — K635 Polyp of colon: Secondary | ICD-10-CM | POA: Diagnosis not present

## 2017-12-02 DIAGNOSIS — E6609 Other obesity due to excess calories: Secondary | ICD-10-CM | POA: Diagnosis not present

## 2017-12-02 DIAGNOSIS — N183 Chronic kidney disease, stage 3 (moderate): Secondary | ICD-10-CM | POA: Diagnosis not present

## 2017-12-02 DIAGNOSIS — Z1389 Encounter for screening for other disorder: Secondary | ICD-10-CM | POA: Diagnosis not present

## 2017-12-02 DIAGNOSIS — M859 Disorder of bone density and structure, unspecified: Secondary | ICD-10-CM | POA: Diagnosis not present

## 2017-12-02 DIAGNOSIS — E7800 Pure hypercholesterolemia, unspecified: Secondary | ICD-10-CM | POA: Diagnosis not present

## 2017-12-02 DIAGNOSIS — Z6836 Body mass index (BMI) 36.0-36.9, adult: Secondary | ICD-10-CM | POA: Diagnosis not present

## 2017-12-02 DIAGNOSIS — M1990 Unspecified osteoarthritis, unspecified site: Secondary | ICD-10-CM | POA: Diagnosis not present

## 2017-12-02 DIAGNOSIS — G894 Chronic pain syndrome: Secondary | ICD-10-CM | POA: Diagnosis not present

## 2017-12-06 DIAGNOSIS — M9903 Segmental and somatic dysfunction of lumbar region: Secondary | ICD-10-CM | POA: Diagnosis not present

## 2017-12-06 DIAGNOSIS — M62830 Muscle spasm of back: Secondary | ICD-10-CM | POA: Diagnosis not present

## 2017-12-06 DIAGNOSIS — M5441 Lumbago with sciatica, right side: Secondary | ICD-10-CM | POA: Diagnosis not present

## 2017-12-06 DIAGNOSIS — M5136 Other intervertebral disc degeneration, lumbar region: Secondary | ICD-10-CM | POA: Diagnosis not present

## 2017-12-20 DIAGNOSIS — M9903 Segmental and somatic dysfunction of lumbar region: Secondary | ICD-10-CM | POA: Diagnosis not present

## 2017-12-20 DIAGNOSIS — M5136 Other intervertebral disc degeneration, lumbar region: Secondary | ICD-10-CM | POA: Diagnosis not present

## 2017-12-20 DIAGNOSIS — M5441 Lumbago with sciatica, right side: Secondary | ICD-10-CM | POA: Diagnosis not present

## 2017-12-20 DIAGNOSIS — M62830 Muscle spasm of back: Secondary | ICD-10-CM | POA: Diagnosis not present

## 2017-12-27 DIAGNOSIS — M62830 Muscle spasm of back: Secondary | ICD-10-CM | POA: Diagnosis not present

## 2017-12-27 DIAGNOSIS — M5136 Other intervertebral disc degeneration, lumbar region: Secondary | ICD-10-CM | POA: Diagnosis not present

## 2017-12-27 DIAGNOSIS — M5441 Lumbago with sciatica, right side: Secondary | ICD-10-CM | POA: Diagnosis not present

## 2017-12-27 DIAGNOSIS — M9903 Segmental and somatic dysfunction of lumbar region: Secondary | ICD-10-CM | POA: Diagnosis not present

## 2017-12-30 DIAGNOSIS — Z1389 Encounter for screening for other disorder: Secondary | ICD-10-CM | POA: Diagnosis not present

## 2017-12-30 DIAGNOSIS — M1990 Unspecified osteoarthritis, unspecified site: Secondary | ICD-10-CM | POA: Diagnosis not present

## 2017-12-30 DIAGNOSIS — Z6836 Body mass index (BMI) 36.0-36.9, adult: Secondary | ICD-10-CM | POA: Diagnosis not present

## 2017-12-30 DIAGNOSIS — M859 Disorder of bone density and structure, unspecified: Secondary | ICD-10-CM | POA: Diagnosis not present

## 2017-12-30 DIAGNOSIS — Z0000 Encounter for general adult medical examination without abnormal findings: Secondary | ICD-10-CM | POA: Diagnosis not present

## 2017-12-30 DIAGNOSIS — E7800 Pure hypercholesterolemia, unspecified: Secondary | ICD-10-CM | POA: Diagnosis not present

## 2017-12-30 DIAGNOSIS — K635 Polyp of colon: Secondary | ICD-10-CM | POA: Diagnosis not present

## 2017-12-30 DIAGNOSIS — E6609 Other obesity due to excess calories: Secondary | ICD-10-CM | POA: Diagnosis not present

## 2017-12-30 DIAGNOSIS — N183 Chronic kidney disease, stage 3 (moderate): Secondary | ICD-10-CM | POA: Diagnosis not present

## 2017-12-30 DIAGNOSIS — I10 Essential (primary) hypertension: Secondary | ICD-10-CM | POA: Diagnosis not present

## 2017-12-30 DIAGNOSIS — F17211 Nicotine dependence, cigarettes, in remission: Secondary | ICD-10-CM | POA: Diagnosis not present

## 2017-12-30 DIAGNOSIS — G894 Chronic pain syndrome: Secondary | ICD-10-CM | POA: Diagnosis not present

## 2018-01-04 ENCOUNTER — Other Ambulatory Visit: Payer: Self-pay | Admitting: Family Medicine

## 2018-01-04 DIAGNOSIS — N183 Chronic kidney disease, stage 3 unspecified: Secondary | ICD-10-CM

## 2018-01-09 DIAGNOSIS — M5136 Other intervertebral disc degeneration, lumbar region: Secondary | ICD-10-CM | POA: Diagnosis not present

## 2018-01-09 DIAGNOSIS — M5441 Lumbago with sciatica, right side: Secondary | ICD-10-CM | POA: Diagnosis not present

## 2018-01-09 DIAGNOSIS — M62830 Muscle spasm of back: Secondary | ICD-10-CM | POA: Diagnosis not present

## 2018-01-09 DIAGNOSIS — M9903 Segmental and somatic dysfunction of lumbar region: Secondary | ICD-10-CM | POA: Diagnosis not present

## 2018-01-25 DIAGNOSIS — M5136 Other intervertebral disc degeneration, lumbar region: Secondary | ICD-10-CM | POA: Diagnosis not present

## 2018-01-25 DIAGNOSIS — M9903 Segmental and somatic dysfunction of lumbar region: Secondary | ICD-10-CM | POA: Diagnosis not present

## 2018-01-25 DIAGNOSIS — M62830 Muscle spasm of back: Secondary | ICD-10-CM | POA: Diagnosis not present

## 2018-01-25 DIAGNOSIS — M5441 Lumbago with sciatica, right side: Secondary | ICD-10-CM | POA: Diagnosis not present

## 2018-02-07 DIAGNOSIS — M545 Low back pain: Secondary | ICD-10-CM | POA: Diagnosis not present

## 2018-02-07 DIAGNOSIS — M7061 Trochanteric bursitis, right hip: Secondary | ICD-10-CM | POA: Diagnosis not present

## 2018-02-08 ENCOUNTER — Other Ambulatory Visit: Payer: Self-pay | Admitting: Orthopedic Surgery

## 2018-02-08 DIAGNOSIS — M545 Low back pain, unspecified: Secondary | ICD-10-CM

## 2018-02-10 ENCOUNTER — Ambulatory Visit
Admission: RE | Admit: 2018-02-10 | Discharge: 2018-02-10 | Disposition: A | Discharge status: Home / Self Care | Payer: PPO | Source: Ambulatory Visit | Attending: Family Medicine | Admitting: Family Medicine

## 2018-02-10 DIAGNOSIS — N183 Chronic kidney disease, stage 3 unspecified: Secondary | ICD-10-CM

## 2018-02-13 DIAGNOSIS — M5441 Lumbago with sciatica, right side: Secondary | ICD-10-CM | POA: Diagnosis not present

## 2018-02-13 DIAGNOSIS — M62830 Muscle spasm of back: Secondary | ICD-10-CM | POA: Diagnosis not present

## 2018-02-13 DIAGNOSIS — M9903 Segmental and somatic dysfunction of lumbar region: Secondary | ICD-10-CM | POA: Diagnosis not present

## 2018-02-13 DIAGNOSIS — M5136 Other intervertebral disc degeneration, lumbar region: Secondary | ICD-10-CM | POA: Diagnosis not present

## 2018-02-24 ENCOUNTER — Ambulatory Visit
Admission: RE | Admit: 2018-02-24 | Discharge: 2018-02-24 | Disposition: A | Discharge status: Home / Self Care | Payer: PPO | Source: Ambulatory Visit | Attending: Orthopedic Surgery | Admitting: Orthopedic Surgery

## 2018-02-24 DIAGNOSIS — M545 Low back pain, unspecified: Secondary | ICD-10-CM

## 2018-02-24 DIAGNOSIS — M48061 Spinal stenosis, lumbar region without neurogenic claudication: Secondary | ICD-10-CM | POA: Diagnosis not present

## 2018-03-07 DIAGNOSIS — M5136 Other intervertebral disc degeneration, lumbar region: Secondary | ICD-10-CM | POA: Diagnosis not present

## 2018-03-07 DIAGNOSIS — M419 Scoliosis, unspecified: Secondary | ICD-10-CM | POA: Diagnosis not present

## 2018-03-13 DIAGNOSIS — M5417 Radiculopathy, lumbosacral region: Secondary | ICD-10-CM | POA: Diagnosis not present

## 2018-03-14 DIAGNOSIS — M5136 Other intervertebral disc degeneration, lumbar region: Secondary | ICD-10-CM | POA: Diagnosis not present

## 2018-03-14 DIAGNOSIS — M62830 Muscle spasm of back: Secondary | ICD-10-CM | POA: Diagnosis not present

## 2018-03-14 DIAGNOSIS — M9903 Segmental and somatic dysfunction of lumbar region: Secondary | ICD-10-CM | POA: Diagnosis not present

## 2018-03-14 DIAGNOSIS — M5441 Lumbago with sciatica, right side: Secondary | ICD-10-CM | POA: Diagnosis not present

## 2018-03-22 DIAGNOSIS — Z96651 Presence of right artificial knee joint: Secondary | ICD-10-CM | POA: Diagnosis not present

## 2018-03-22 DIAGNOSIS — M1711 Unilateral primary osteoarthritis, right knee: Secondary | ICD-10-CM | POA: Diagnosis not present

## 2018-03-22 DIAGNOSIS — M7051 Other bursitis of knee, right knee: Secondary | ICD-10-CM | POA: Diagnosis not present

## 2018-03-28 DIAGNOSIS — M5441 Lumbago with sciatica, right side: Secondary | ICD-10-CM | POA: Diagnosis not present

## 2018-03-28 DIAGNOSIS — M5136 Other intervertebral disc degeneration, lumbar region: Secondary | ICD-10-CM | POA: Diagnosis not present

## 2018-03-28 DIAGNOSIS — M62830 Muscle spasm of back: Secondary | ICD-10-CM | POA: Diagnosis not present

## 2018-03-28 DIAGNOSIS — M9903 Segmental and somatic dysfunction of lumbar region: Secondary | ICD-10-CM | POA: Diagnosis not present

## 2018-04-25 DIAGNOSIS — M5136 Other intervertebral disc degeneration, lumbar region: Secondary | ICD-10-CM | POA: Diagnosis not present

## 2018-05-04 DIAGNOSIS — M9903 Segmental and somatic dysfunction of lumbar region: Secondary | ICD-10-CM | POA: Diagnosis not present

## 2018-05-04 DIAGNOSIS — M62830 Muscle spasm of back: Secondary | ICD-10-CM | POA: Diagnosis not present

## 2018-05-04 DIAGNOSIS — M5441 Lumbago with sciatica, right side: Secondary | ICD-10-CM | POA: Diagnosis not present

## 2018-05-04 DIAGNOSIS — M5136 Other intervertebral disc degeneration, lumbar region: Secondary | ICD-10-CM | POA: Diagnosis not present

## 2018-05-18 DIAGNOSIS — M5441 Lumbago with sciatica, right side: Secondary | ICD-10-CM | POA: Diagnosis not present

## 2018-05-18 DIAGNOSIS — M5136 Other intervertebral disc degeneration, lumbar region: Secondary | ICD-10-CM | POA: Diagnosis not present

## 2018-05-18 DIAGNOSIS — M9903 Segmental and somatic dysfunction of lumbar region: Secondary | ICD-10-CM | POA: Diagnosis not present

## 2018-05-18 DIAGNOSIS — M62830 Muscle spasm of back: Secondary | ICD-10-CM | POA: Diagnosis not present

## 2018-05-30 DIAGNOSIS — M5136 Other intervertebral disc degeneration, lumbar region: Secondary | ICD-10-CM | POA: Diagnosis not present

## 2018-05-30 DIAGNOSIS — M5441 Lumbago with sciatica, right side: Secondary | ICD-10-CM | POA: Diagnosis not present

## 2018-05-30 DIAGNOSIS — M62830 Muscle spasm of back: Secondary | ICD-10-CM | POA: Diagnosis not present

## 2018-05-30 DIAGNOSIS — M9903 Segmental and somatic dysfunction of lumbar region: Secondary | ICD-10-CM | POA: Diagnosis not present

## 2018-06-09 DIAGNOSIS — M1990 Unspecified osteoarthritis, unspecified site: Secondary | ICD-10-CM | POA: Diagnosis not present

## 2018-06-09 DIAGNOSIS — E7800 Pure hypercholesterolemia, unspecified: Secondary | ICD-10-CM | POA: Diagnosis not present

## 2018-06-09 DIAGNOSIS — I129 Hypertensive chronic kidney disease with stage 1 through stage 4 chronic kidney disease, or unspecified chronic kidney disease: Secondary | ICD-10-CM | POA: Diagnosis not present

## 2018-06-09 DIAGNOSIS — F17211 Nicotine dependence, cigarettes, in remission: Secondary | ICD-10-CM | POA: Diagnosis not present

## 2018-06-09 DIAGNOSIS — G894 Chronic pain syndrome: Secondary | ICD-10-CM | POA: Diagnosis not present

## 2018-06-09 DIAGNOSIS — N183 Chronic kidney disease, stage 3 (moderate): Secondary | ICD-10-CM | POA: Diagnosis not present

## 2018-06-13 DIAGNOSIS — M62830 Muscle spasm of back: Secondary | ICD-10-CM | POA: Diagnosis not present

## 2018-06-13 DIAGNOSIS — M9903 Segmental and somatic dysfunction of lumbar region: Secondary | ICD-10-CM | POA: Diagnosis not present

## 2018-06-13 DIAGNOSIS — M5136 Other intervertebral disc degeneration, lumbar region: Secondary | ICD-10-CM | POA: Diagnosis not present

## 2018-06-13 DIAGNOSIS — M5441 Lumbago with sciatica, right side: Secondary | ICD-10-CM | POA: Diagnosis not present

## 2018-06-14 DIAGNOSIS — M47817 Spondylosis without myelopathy or radiculopathy, lumbosacral region: Secondary | ICD-10-CM | POA: Diagnosis not present

## 2018-06-27 DIAGNOSIS — M5136 Other intervertebral disc degeneration, lumbar region: Secondary | ICD-10-CM | POA: Diagnosis not present

## 2018-06-27 DIAGNOSIS — M5441 Lumbago with sciatica, right side: Secondary | ICD-10-CM | POA: Diagnosis not present

## 2018-06-27 DIAGNOSIS — M9903 Segmental and somatic dysfunction of lumbar region: Secondary | ICD-10-CM | POA: Diagnosis not present

## 2018-06-27 DIAGNOSIS — M62830 Muscle spasm of back: Secondary | ICD-10-CM | POA: Diagnosis not present

## 2018-07-06 DIAGNOSIS — M47816 Spondylosis without myelopathy or radiculopathy, lumbar region: Secondary | ICD-10-CM | POA: Diagnosis not present

## 2018-07-19 DIAGNOSIS — H2511 Age-related nuclear cataract, right eye: Secondary | ICD-10-CM | POA: Diagnosis not present

## 2018-07-19 DIAGNOSIS — E7800 Pure hypercholesterolemia, unspecified: Secondary | ICD-10-CM | POA: Diagnosis not present

## 2018-07-19 DIAGNOSIS — G894 Chronic pain syndrome: Secondary | ICD-10-CM | POA: Diagnosis not present

## 2018-07-19 DIAGNOSIS — N183 Chronic kidney disease, stage 3 (moderate): Secondary | ICD-10-CM | POA: Diagnosis not present

## 2018-07-19 DIAGNOSIS — I129 Hypertensive chronic kidney disease with stage 1 through stage 4 chronic kidney disease, or unspecified chronic kidney disease: Secondary | ICD-10-CM | POA: Diagnosis not present

## 2018-08-01 DIAGNOSIS — M9903 Segmental and somatic dysfunction of lumbar region: Secondary | ICD-10-CM | POA: Diagnosis not present

## 2018-08-01 DIAGNOSIS — M62830 Muscle spasm of back: Secondary | ICD-10-CM | POA: Diagnosis not present

## 2018-08-01 DIAGNOSIS — M5441 Lumbago with sciatica, right side: Secondary | ICD-10-CM | POA: Diagnosis not present

## 2018-08-01 DIAGNOSIS — M5136 Other intervertebral disc degeneration, lumbar region: Secondary | ICD-10-CM | POA: Diagnosis not present

## 2018-08-22 DIAGNOSIS — M9903 Segmental and somatic dysfunction of lumbar region: Secondary | ICD-10-CM | POA: Diagnosis not present

## 2018-08-22 DIAGNOSIS — M62830 Muscle spasm of back: Secondary | ICD-10-CM | POA: Diagnosis not present

## 2018-08-22 DIAGNOSIS — M5441 Lumbago with sciatica, right side: Secondary | ICD-10-CM | POA: Diagnosis not present

## 2018-08-22 DIAGNOSIS — M5136 Other intervertebral disc degeneration, lumbar region: Secondary | ICD-10-CM | POA: Diagnosis not present

## 2018-09-04 DIAGNOSIS — M25571 Pain in right ankle and joints of right foot: Secondary | ICD-10-CM | POA: Diagnosis not present

## 2018-09-04 DIAGNOSIS — M19071 Primary osteoarthritis, right ankle and foot: Secondary | ICD-10-CM | POA: Diagnosis not present

## 2018-09-05 DIAGNOSIS — M5441 Lumbago with sciatica, right side: Secondary | ICD-10-CM | POA: Diagnosis not present

## 2018-09-05 DIAGNOSIS — M62830 Muscle spasm of back: Secondary | ICD-10-CM | POA: Diagnosis not present

## 2018-09-05 DIAGNOSIS — M9903 Segmental and somatic dysfunction of lumbar region: Secondary | ICD-10-CM | POA: Diagnosis not present

## 2018-09-05 DIAGNOSIS — M5136 Other intervertebral disc degeneration, lumbar region: Secondary | ICD-10-CM | POA: Diagnosis not present

## 2018-09-07 DIAGNOSIS — M25571 Pain in right ankle and joints of right foot: Secondary | ICD-10-CM | POA: Diagnosis not present

## 2018-09-07 DIAGNOSIS — M19071 Primary osteoarthritis, right ankle and foot: Secondary | ICD-10-CM | POA: Diagnosis not present

## 2018-09-26 DIAGNOSIS — M5136 Other intervertebral disc degeneration, lumbar region: Secondary | ICD-10-CM | POA: Diagnosis not present

## 2018-09-26 DIAGNOSIS — M5441 Lumbago with sciatica, right side: Secondary | ICD-10-CM | POA: Diagnosis not present

## 2018-09-26 DIAGNOSIS — M62830 Muscle spasm of back: Secondary | ICD-10-CM | POA: Diagnosis not present

## 2018-09-26 DIAGNOSIS — M9903 Segmental and somatic dysfunction of lumbar region: Secondary | ICD-10-CM | POA: Diagnosis not present

## 2018-09-26 DIAGNOSIS — M25571 Pain in right ankle and joints of right foot: Secondary | ICD-10-CM | POA: Diagnosis not present

## 2018-09-27 DIAGNOSIS — M25571 Pain in right ankle and joints of right foot: Secondary | ICD-10-CM | POA: Diagnosis not present

## 2018-10-06 DIAGNOSIS — D225 Melanocytic nevi of trunk: Secondary | ICD-10-CM | POA: Diagnosis not present

## 2018-10-06 DIAGNOSIS — D1801 Hemangioma of skin and subcutaneous tissue: Secondary | ICD-10-CM | POA: Diagnosis not present

## 2018-10-06 DIAGNOSIS — L918 Other hypertrophic disorders of the skin: Secondary | ICD-10-CM | POA: Diagnosis not present

## 2018-10-06 DIAGNOSIS — Z85828 Personal history of other malignant neoplasm of skin: Secondary | ICD-10-CM | POA: Diagnosis not present

## 2018-10-06 DIAGNOSIS — L821 Other seborrheic keratosis: Secondary | ICD-10-CM | POA: Diagnosis not present

## 2018-10-06 DIAGNOSIS — L570 Actinic keratosis: Secondary | ICD-10-CM | POA: Diagnosis not present

## 2018-10-06 DIAGNOSIS — D485 Neoplasm of uncertain behavior of skin: Secondary | ICD-10-CM | POA: Diagnosis not present

## 2018-10-06 DIAGNOSIS — L738 Other specified follicular disorders: Secondary | ICD-10-CM | POA: Diagnosis not present

## 2018-10-06 DIAGNOSIS — L700 Acne vulgaris: Secondary | ICD-10-CM | POA: Diagnosis not present

## 2018-10-06 DIAGNOSIS — L814 Other melanin hyperpigmentation: Secondary | ICD-10-CM | POA: Diagnosis not present

## 2018-11-30 DIAGNOSIS — M5441 Lumbago with sciatica, right side: Secondary | ICD-10-CM | POA: Diagnosis not present

## 2018-11-30 DIAGNOSIS — M5136 Other intervertebral disc degeneration, lumbar region: Secondary | ICD-10-CM | POA: Diagnosis not present

## 2018-11-30 DIAGNOSIS — Z803 Family history of malignant neoplasm of breast: Secondary | ICD-10-CM | POA: Diagnosis not present

## 2018-11-30 DIAGNOSIS — M9903 Segmental and somatic dysfunction of lumbar region: Secondary | ICD-10-CM | POA: Diagnosis not present

## 2018-11-30 DIAGNOSIS — Z1231 Encounter for screening mammogram for malignant neoplasm of breast: Secondary | ICD-10-CM | POA: Diagnosis not present

## 2018-11-30 DIAGNOSIS — M62830 Muscle spasm of back: Secondary | ICD-10-CM | POA: Diagnosis not present

## 2018-12-12 DIAGNOSIS — M19071 Primary osteoarthritis, right ankle and foot: Secondary | ICD-10-CM | POA: Diagnosis not present

## 2018-12-15 DIAGNOSIS — M859 Disorder of bone density and structure, unspecified: Secondary | ICD-10-CM | POA: Diagnosis not present

## 2018-12-15 DIAGNOSIS — M1990 Unspecified osteoarthritis, unspecified site: Secondary | ICD-10-CM | POA: Diagnosis not present

## 2018-12-15 DIAGNOSIS — F17211 Nicotine dependence, cigarettes, in remission: Secondary | ICD-10-CM | POA: Diagnosis not present

## 2018-12-15 DIAGNOSIS — N3946 Mixed incontinence: Secondary | ICD-10-CM | POA: Diagnosis not present

## 2018-12-15 DIAGNOSIS — Z0000 Encounter for general adult medical examination without abnormal findings: Secondary | ICD-10-CM | POA: Diagnosis not present

## 2018-12-15 DIAGNOSIS — R140 Abdominal distension (gaseous): Secondary | ICD-10-CM | POA: Diagnosis not present

## 2018-12-15 DIAGNOSIS — K635 Polyp of colon: Secondary | ICD-10-CM | POA: Diagnosis not present

## 2018-12-15 DIAGNOSIS — G894 Chronic pain syndrome: Secondary | ICD-10-CM | POA: Diagnosis not present

## 2018-12-15 DIAGNOSIS — I129 Hypertensive chronic kidney disease with stage 1 through stage 4 chronic kidney disease, or unspecified chronic kidney disease: Secondary | ICD-10-CM | POA: Diagnosis not present

## 2018-12-15 DIAGNOSIS — E7800 Pure hypercholesterolemia, unspecified: Secondary | ICD-10-CM | POA: Diagnosis not present

## 2018-12-27 DIAGNOSIS — I129 Hypertensive chronic kidney disease with stage 1 through stage 4 chronic kidney disease, or unspecified chronic kidney disease: Secondary | ICD-10-CM | POA: Diagnosis not present

## 2018-12-27 DIAGNOSIS — G894 Chronic pain syndrome: Secondary | ICD-10-CM | POA: Diagnosis not present

## 2018-12-27 DIAGNOSIS — M1990 Unspecified osteoarthritis, unspecified site: Secondary | ICD-10-CM | POA: Diagnosis not present

## 2018-12-27 DIAGNOSIS — F17211 Nicotine dependence, cigarettes, in remission: Secondary | ICD-10-CM | POA: Diagnosis not present

## 2018-12-27 DIAGNOSIS — E7800 Pure hypercholesterolemia, unspecified: Secondary | ICD-10-CM | POA: Diagnosis not present

## 2018-12-27 DIAGNOSIS — M859 Disorder of bone density and structure, unspecified: Secondary | ICD-10-CM | POA: Diagnosis not present

## 2018-12-27 DIAGNOSIS — Z0000 Encounter for general adult medical examination without abnormal findings: Secondary | ICD-10-CM | POA: Diagnosis not present

## 2018-12-27 DIAGNOSIS — R140 Abdominal distension (gaseous): Secondary | ICD-10-CM | POA: Diagnosis not present

## 2018-12-27 DIAGNOSIS — K635 Polyp of colon: Secondary | ICD-10-CM | POA: Diagnosis not present

## 2018-12-27 DIAGNOSIS — N3946 Mixed incontinence: Secondary | ICD-10-CM | POA: Diagnosis not present

## 2019-01-08 DIAGNOSIS — M79642 Pain in left hand: Secondary | ICD-10-CM | POA: Diagnosis not present

## 2019-01-08 DIAGNOSIS — S63502A Unspecified sprain of left wrist, initial encounter: Secondary | ICD-10-CM | POA: Diagnosis not present

## 2019-01-29 DIAGNOSIS — S63502A Unspecified sprain of left wrist, initial encounter: Secondary | ICD-10-CM | POA: Diagnosis not present

## 2019-02-08 DIAGNOSIS — M62830 Muscle spasm of back: Secondary | ICD-10-CM | POA: Diagnosis not present

## 2019-02-08 DIAGNOSIS — M9903 Segmental and somatic dysfunction of lumbar region: Secondary | ICD-10-CM | POA: Diagnosis not present

## 2019-02-08 DIAGNOSIS — M5136 Other intervertebral disc degeneration, lumbar region: Secondary | ICD-10-CM | POA: Diagnosis not present

## 2019-02-08 DIAGNOSIS — M5441 Lumbago with sciatica, right side: Secondary | ICD-10-CM | POA: Diagnosis not present

## 2019-02-19 ENCOUNTER — Other Ambulatory Visit: Payer: Self-pay

## 2019-02-19 DIAGNOSIS — Z20822 Contact with and (suspected) exposure to covid-19: Secondary | ICD-10-CM

## 2019-02-20 LAB — NOVEL CORONAVIRUS, NAA: SARS-CoV-2, NAA: NOT DETECTED

## 2019-02-21 ENCOUNTER — Telehealth: Payer: Self-pay | Admitting: Family Medicine

## 2019-02-21 NOTE — Telephone Encounter (Signed)
Negative COVID results given. Patient results "NOT Detected." Caller expressed understanding. ° °

## 2019-02-26 DIAGNOSIS — M1990 Unspecified osteoarthritis, unspecified site: Secondary | ICD-10-CM | POA: Diagnosis not present

## 2019-02-26 DIAGNOSIS — E7800 Pure hypercholesterolemia, unspecified: Secondary | ICD-10-CM | POA: Diagnosis not present

## 2019-02-26 DIAGNOSIS — N183 Chronic kidney disease, stage 3 unspecified: Secondary | ICD-10-CM | POA: Diagnosis not present

## 2019-02-26 DIAGNOSIS — I129 Hypertensive chronic kidney disease with stage 1 through stage 4 chronic kidney disease, or unspecified chronic kidney disease: Secondary | ICD-10-CM | POA: Diagnosis not present

## 2019-02-28 DIAGNOSIS — Z1159 Encounter for screening for other viral diseases: Secondary | ICD-10-CM | POA: Diagnosis not present

## 2019-03-01 DIAGNOSIS — M5441 Lumbago with sciatica, right side: Secondary | ICD-10-CM | POA: Diagnosis not present

## 2019-03-01 DIAGNOSIS — M5136 Other intervertebral disc degeneration, lumbar region: Secondary | ICD-10-CM | POA: Diagnosis not present

## 2019-03-01 DIAGNOSIS — M9903 Segmental and somatic dysfunction of lumbar region: Secondary | ICD-10-CM | POA: Diagnosis not present

## 2019-03-01 DIAGNOSIS — M62830 Muscle spasm of back: Secondary | ICD-10-CM | POA: Diagnosis not present

## 2019-03-05 DIAGNOSIS — K2930 Chronic superficial gastritis without bleeding: Secondary | ICD-10-CM | POA: Diagnosis not present

## 2019-03-05 DIAGNOSIS — R140 Abdominal distension (gaseous): Secondary | ICD-10-CM | POA: Diagnosis not present

## 2019-03-05 DIAGNOSIS — K3189 Other diseases of stomach and duodenum: Secondary | ICD-10-CM | POA: Diagnosis not present

## 2019-03-05 DIAGNOSIS — D123 Benign neoplasm of transverse colon: Secondary | ICD-10-CM | POA: Diagnosis not present

## 2019-03-05 DIAGNOSIS — Z86010 Personal history of colonic polyps: Secondary | ICD-10-CM | POA: Diagnosis not present

## 2019-03-08 DIAGNOSIS — K2930 Chronic superficial gastritis without bleeding: Secondary | ICD-10-CM | POA: Diagnosis not present

## 2019-03-08 DIAGNOSIS — D123 Benign neoplasm of transverse colon: Secondary | ICD-10-CM | POA: Diagnosis not present

## 2019-03-22 DIAGNOSIS — M5441 Lumbago with sciatica, right side: Secondary | ICD-10-CM | POA: Diagnosis not present

## 2019-03-22 DIAGNOSIS — M62830 Muscle spasm of back: Secondary | ICD-10-CM | POA: Diagnosis not present

## 2019-03-22 DIAGNOSIS — M5136 Other intervertebral disc degeneration, lumbar region: Secondary | ICD-10-CM | POA: Diagnosis not present

## 2019-03-22 DIAGNOSIS — M9903 Segmental and somatic dysfunction of lumbar region: Secondary | ICD-10-CM | POA: Diagnosis not present

## 2019-04-03 DIAGNOSIS — M5441 Lumbago with sciatica, right side: Secondary | ICD-10-CM | POA: Diagnosis not present

## 2019-04-03 DIAGNOSIS — M62830 Muscle spasm of back: Secondary | ICD-10-CM | POA: Diagnosis not present

## 2019-04-03 DIAGNOSIS — M5136 Other intervertebral disc degeneration, lumbar region: Secondary | ICD-10-CM | POA: Diagnosis not present

## 2019-04-03 DIAGNOSIS — M9903 Segmental and somatic dysfunction of lumbar region: Secondary | ICD-10-CM | POA: Diagnosis not present

## 2019-04-05 DIAGNOSIS — M19071 Primary osteoarthritis, right ankle and foot: Secondary | ICD-10-CM | POA: Diagnosis not present

## 2019-04-05 DIAGNOSIS — M25571 Pain in right ankle and joints of right foot: Secondary | ICD-10-CM | POA: Diagnosis not present

## 2019-04-10 DIAGNOSIS — M9903 Segmental and somatic dysfunction of lumbar region: Secondary | ICD-10-CM | POA: Diagnosis not present

## 2019-04-10 DIAGNOSIS — M62830 Muscle spasm of back: Secondary | ICD-10-CM | POA: Diagnosis not present

## 2019-04-10 DIAGNOSIS — M5136 Other intervertebral disc degeneration, lumbar region: Secondary | ICD-10-CM | POA: Diagnosis not present

## 2019-04-10 DIAGNOSIS — M5441 Lumbago with sciatica, right side: Secondary | ICD-10-CM | POA: Diagnosis not present

## 2019-04-17 DIAGNOSIS — M47816 Spondylosis without myelopathy or radiculopathy, lumbar region: Secondary | ICD-10-CM | POA: Diagnosis not present

## 2019-05-01 DIAGNOSIS — M5136 Other intervertebral disc degeneration, lumbar region: Secondary | ICD-10-CM | POA: Diagnosis not present

## 2019-05-01 DIAGNOSIS — M5441 Lumbago with sciatica, right side: Secondary | ICD-10-CM | POA: Diagnosis not present

## 2019-05-01 DIAGNOSIS — M9903 Segmental and somatic dysfunction of lumbar region: Secondary | ICD-10-CM | POA: Diagnosis not present

## 2019-05-01 DIAGNOSIS — M62830 Muscle spasm of back: Secondary | ICD-10-CM | POA: Diagnosis not present

## 2019-05-23 DIAGNOSIS — M62830 Muscle spasm of back: Secondary | ICD-10-CM | POA: Diagnosis not present

## 2019-05-23 DIAGNOSIS — M5136 Other intervertebral disc degeneration, lumbar region: Secondary | ICD-10-CM | POA: Diagnosis not present

## 2019-05-23 DIAGNOSIS — M1990 Unspecified osteoarthritis, unspecified site: Secondary | ICD-10-CM | POA: Diagnosis not present

## 2019-05-23 DIAGNOSIS — M5441 Lumbago with sciatica, right side: Secondary | ICD-10-CM | POA: Diagnosis not present

## 2019-05-23 DIAGNOSIS — E7800 Pure hypercholesterolemia, unspecified: Secondary | ICD-10-CM | POA: Diagnosis not present

## 2019-05-23 DIAGNOSIS — I129 Hypertensive chronic kidney disease with stage 1 through stage 4 chronic kidney disease, or unspecified chronic kidney disease: Secondary | ICD-10-CM | POA: Diagnosis not present

## 2019-05-23 DIAGNOSIS — M9903 Segmental and somatic dysfunction of lumbar region: Secondary | ICD-10-CM | POA: Diagnosis not present

## 2019-05-30 DIAGNOSIS — M9903 Segmental and somatic dysfunction of lumbar region: Secondary | ICD-10-CM | POA: Diagnosis not present

## 2019-05-30 DIAGNOSIS — M5136 Other intervertebral disc degeneration, lumbar region: Secondary | ICD-10-CM | POA: Diagnosis not present

## 2019-05-30 DIAGNOSIS — M62830 Muscle spasm of back: Secondary | ICD-10-CM | POA: Diagnosis not present

## 2019-05-30 DIAGNOSIS — M5441 Lumbago with sciatica, right side: Secondary | ICD-10-CM | POA: Diagnosis not present

## 2019-06-13 DIAGNOSIS — M5136 Other intervertebral disc degeneration, lumbar region: Secondary | ICD-10-CM | POA: Diagnosis not present

## 2019-06-13 DIAGNOSIS — M62830 Muscle spasm of back: Secondary | ICD-10-CM | POA: Diagnosis not present

## 2019-06-13 DIAGNOSIS — M9903 Segmental and somatic dysfunction of lumbar region: Secondary | ICD-10-CM | POA: Diagnosis not present

## 2019-06-13 DIAGNOSIS — M5441 Lumbago with sciatica, right side: Secondary | ICD-10-CM | POA: Diagnosis not present

## 2019-06-25 DIAGNOSIS — M5441 Lumbago with sciatica, right side: Secondary | ICD-10-CM | POA: Diagnosis not present

## 2019-06-25 DIAGNOSIS — M5136 Other intervertebral disc degeneration, lumbar region: Secondary | ICD-10-CM | POA: Diagnosis not present

## 2019-06-25 DIAGNOSIS — M62830 Muscle spasm of back: Secondary | ICD-10-CM | POA: Diagnosis not present

## 2019-06-25 DIAGNOSIS — M9903 Segmental and somatic dysfunction of lumbar region: Secondary | ICD-10-CM | POA: Diagnosis not present

## 2019-07-06 DIAGNOSIS — E7800 Pure hypercholesterolemia, unspecified: Secondary | ICD-10-CM | POA: Diagnosis not present

## 2019-07-09 DIAGNOSIS — M62830 Muscle spasm of back: Secondary | ICD-10-CM | POA: Diagnosis not present

## 2019-07-09 DIAGNOSIS — M5136 Other intervertebral disc degeneration, lumbar region: Secondary | ICD-10-CM | POA: Diagnosis not present

## 2019-07-09 DIAGNOSIS — M9903 Segmental and somatic dysfunction of lumbar region: Secondary | ICD-10-CM | POA: Diagnosis not present

## 2019-07-09 DIAGNOSIS — M5441 Lumbago with sciatica, right side: Secondary | ICD-10-CM | POA: Diagnosis not present

## 2019-07-17 DIAGNOSIS — M170 Bilateral primary osteoarthritis of knee: Secondary | ICD-10-CM | POA: Diagnosis not present

## 2019-07-17 DIAGNOSIS — M1711 Unilateral primary osteoarthritis, right knee: Secondary | ICD-10-CM | POA: Diagnosis not present

## 2019-07-17 DIAGNOSIS — Z96651 Presence of right artificial knee joint: Secondary | ICD-10-CM | POA: Diagnosis not present

## 2019-07-17 DIAGNOSIS — M1712 Unilateral primary osteoarthritis, left knee: Secondary | ICD-10-CM | POA: Diagnosis not present

## 2019-07-18 DIAGNOSIS — M5416 Radiculopathy, lumbar region: Secondary | ICD-10-CM | POA: Diagnosis not present

## 2019-07-23 DIAGNOSIS — M5441 Lumbago with sciatica, right side: Secondary | ICD-10-CM | POA: Diagnosis not present

## 2019-07-23 DIAGNOSIS — M62830 Muscle spasm of back: Secondary | ICD-10-CM | POA: Diagnosis not present

## 2019-07-23 DIAGNOSIS — M9903 Segmental and somatic dysfunction of lumbar region: Secondary | ICD-10-CM | POA: Diagnosis not present

## 2019-07-23 DIAGNOSIS — M5136 Other intervertebral disc degeneration, lumbar region: Secondary | ICD-10-CM | POA: Diagnosis not present

## 2019-07-27 DIAGNOSIS — M19071 Primary osteoarthritis, right ankle and foot: Secondary | ICD-10-CM | POA: Diagnosis not present

## 2019-07-27 DIAGNOSIS — M85851 Other specified disorders of bone density and structure, right thigh: Secondary | ICD-10-CM | POA: Diagnosis not present

## 2019-07-27 DIAGNOSIS — M25571 Pain in right ankle and joints of right foot: Secondary | ICD-10-CM | POA: Diagnosis not present

## 2019-08-06 DIAGNOSIS — M9903 Segmental and somatic dysfunction of lumbar region: Secondary | ICD-10-CM | POA: Diagnosis not present

## 2019-08-06 DIAGNOSIS — M5136 Other intervertebral disc degeneration, lumbar region: Secondary | ICD-10-CM | POA: Diagnosis not present

## 2019-08-06 DIAGNOSIS — M62830 Muscle spasm of back: Secondary | ICD-10-CM | POA: Diagnosis not present

## 2019-08-06 DIAGNOSIS — M5441 Lumbago with sciatica, right side: Secondary | ICD-10-CM | POA: Diagnosis not present

## 2019-08-08 DIAGNOSIS — M5136 Other intervertebral disc degeneration, lumbar region: Secondary | ICD-10-CM | POA: Diagnosis not present

## 2019-08-08 DIAGNOSIS — M4726 Other spondylosis with radiculopathy, lumbar region: Secondary | ICD-10-CM | POA: Diagnosis not present

## 2019-08-08 DIAGNOSIS — M419 Scoliosis, unspecified: Secondary | ICD-10-CM | POA: Diagnosis not present

## 2019-08-08 DIAGNOSIS — M48061 Spinal stenosis, lumbar region without neurogenic claudication: Secondary | ICD-10-CM | POA: Diagnosis not present

## 2019-08-10 DIAGNOSIS — M545 Low back pain: Secondary | ICD-10-CM | POA: Diagnosis not present

## 2019-08-10 DIAGNOSIS — M6281 Muscle weakness (generalized): Secondary | ICD-10-CM | POA: Diagnosis not present

## 2019-08-10 DIAGNOSIS — M5442 Lumbago with sciatica, left side: Secondary | ICD-10-CM | POA: Diagnosis not present

## 2019-08-13 DIAGNOSIS — N183 Chronic kidney disease, stage 3 unspecified: Secondary | ICD-10-CM | POA: Diagnosis not present

## 2019-08-13 DIAGNOSIS — I129 Hypertensive chronic kidney disease with stage 1 through stage 4 chronic kidney disease, or unspecified chronic kidney disease: Secondary | ICD-10-CM | POA: Diagnosis not present

## 2019-08-13 DIAGNOSIS — M1990 Unspecified osteoarthritis, unspecified site: Secondary | ICD-10-CM | POA: Diagnosis not present

## 2019-08-13 DIAGNOSIS — E7800 Pure hypercholesterolemia, unspecified: Secondary | ICD-10-CM | POA: Diagnosis not present

## 2019-08-17 DIAGNOSIS — M545 Low back pain: Secondary | ICD-10-CM | POA: Diagnosis not present

## 2019-08-17 DIAGNOSIS — M5442 Lumbago with sciatica, left side: Secondary | ICD-10-CM | POA: Diagnosis not present

## 2019-08-17 DIAGNOSIS — M6281 Muscle weakness (generalized): Secondary | ICD-10-CM | POA: Diagnosis not present

## 2019-08-20 DIAGNOSIS — M9903 Segmental and somatic dysfunction of lumbar region: Secondary | ICD-10-CM | POA: Diagnosis not present

## 2019-08-20 DIAGNOSIS — M5136 Other intervertebral disc degeneration, lumbar region: Secondary | ICD-10-CM | POA: Diagnosis not present

## 2019-08-20 DIAGNOSIS — M5441 Lumbago with sciatica, right side: Secondary | ICD-10-CM | POA: Diagnosis not present

## 2019-08-20 DIAGNOSIS — M62830 Muscle spasm of back: Secondary | ICD-10-CM | POA: Diagnosis not present

## 2019-08-24 DIAGNOSIS — M5442 Lumbago with sciatica, left side: Secondary | ICD-10-CM | POA: Diagnosis not present

## 2019-08-24 DIAGNOSIS — M6281 Muscle weakness (generalized): Secondary | ICD-10-CM | POA: Diagnosis not present

## 2019-08-24 DIAGNOSIS — M545 Low back pain: Secondary | ICD-10-CM | POA: Diagnosis not present

## 2019-08-31 DIAGNOSIS — M5442 Lumbago with sciatica, left side: Secondary | ICD-10-CM | POA: Diagnosis not present

## 2019-08-31 DIAGNOSIS — M545 Low back pain: Secondary | ICD-10-CM | POA: Diagnosis not present

## 2019-08-31 DIAGNOSIS — R69 Illness, unspecified: Secondary | ICD-10-CM | POA: Diagnosis not present

## 2019-08-31 DIAGNOSIS — I129 Hypertensive chronic kidney disease with stage 1 through stage 4 chronic kidney disease, or unspecified chronic kidney disease: Secondary | ICD-10-CM | POA: Diagnosis not present

## 2019-08-31 DIAGNOSIS — M1990 Unspecified osteoarthritis, unspecified site: Secondary | ICD-10-CM | POA: Diagnosis not present

## 2019-08-31 DIAGNOSIS — G894 Chronic pain syndrome: Secondary | ICD-10-CM | POA: Diagnosis not present

## 2019-08-31 DIAGNOSIS — E7800 Pure hypercholesterolemia, unspecified: Secondary | ICD-10-CM | POA: Diagnosis not present

## 2019-08-31 DIAGNOSIS — Z6835 Body mass index (BMI) 35.0-35.9, adult: Secondary | ICD-10-CM | POA: Diagnosis not present

## 2019-08-31 DIAGNOSIS — N183 Chronic kidney disease, stage 3 unspecified: Secondary | ICD-10-CM | POA: Diagnosis not present

## 2019-08-31 DIAGNOSIS — M6281 Muscle weakness (generalized): Secondary | ICD-10-CM | POA: Diagnosis not present

## 2019-09-03 DIAGNOSIS — M5441 Lumbago with sciatica, right side: Secondary | ICD-10-CM | POA: Diagnosis not present

## 2019-09-03 DIAGNOSIS — M9903 Segmental and somatic dysfunction of lumbar region: Secondary | ICD-10-CM | POA: Diagnosis not present

## 2019-09-03 DIAGNOSIS — M5136 Other intervertebral disc degeneration, lumbar region: Secondary | ICD-10-CM | POA: Diagnosis not present

## 2019-09-03 DIAGNOSIS — M62830 Muscle spasm of back: Secondary | ICD-10-CM | POA: Diagnosis not present

## 2019-09-14 DIAGNOSIS — I129 Hypertensive chronic kidney disease with stage 1 through stage 4 chronic kidney disease, or unspecified chronic kidney disease: Secondary | ICD-10-CM | POA: Diagnosis not present

## 2019-09-14 DIAGNOSIS — M6281 Muscle weakness (generalized): Secondary | ICD-10-CM | POA: Diagnosis not present

## 2019-09-14 DIAGNOSIS — M545 Low back pain: Secondary | ICD-10-CM | POA: Diagnosis not present

## 2019-09-14 DIAGNOSIS — M1990 Unspecified osteoarthritis, unspecified site: Secondary | ICD-10-CM | POA: Diagnosis not present

## 2019-09-14 DIAGNOSIS — E7800 Pure hypercholesterolemia, unspecified: Secondary | ICD-10-CM | POA: Diagnosis not present

## 2019-09-14 DIAGNOSIS — N183 Chronic kidney disease, stage 3 unspecified: Secondary | ICD-10-CM | POA: Diagnosis not present

## 2019-09-14 DIAGNOSIS — M5442 Lumbago with sciatica, left side: Secondary | ICD-10-CM | POA: Diagnosis not present

## 2019-10-05 DIAGNOSIS — L918 Other hypertrophic disorders of the skin: Secondary | ICD-10-CM | POA: Diagnosis not present

## 2019-10-05 DIAGNOSIS — D1801 Hemangioma of skin and subcutaneous tissue: Secondary | ICD-10-CM | POA: Diagnosis not present

## 2019-10-05 DIAGNOSIS — L820 Inflamed seborrheic keratosis: Secondary | ICD-10-CM | POA: Diagnosis not present

## 2019-10-05 DIAGNOSIS — L814 Other melanin hyperpigmentation: Secondary | ICD-10-CM | POA: Diagnosis not present

## 2019-10-05 DIAGNOSIS — D225 Melanocytic nevi of trunk: Secondary | ICD-10-CM | POA: Diagnosis not present

## 2019-10-05 DIAGNOSIS — D2262 Melanocytic nevi of left upper limb, including shoulder: Secondary | ICD-10-CM | POA: Diagnosis not present

## 2019-10-05 DIAGNOSIS — Z85828 Personal history of other malignant neoplasm of skin: Secondary | ICD-10-CM | POA: Diagnosis not present

## 2019-10-05 DIAGNOSIS — L821 Other seborrheic keratosis: Secondary | ICD-10-CM | POA: Diagnosis not present

## 2019-10-05 DIAGNOSIS — D2272 Melanocytic nevi of left lower limb, including hip: Secondary | ICD-10-CM | POA: Diagnosis not present

## 2019-10-05 DIAGNOSIS — L570 Actinic keratosis: Secondary | ICD-10-CM | POA: Diagnosis not present

## 2019-10-17 IMAGING — MR MR LUMBAR SPINE W/O CM
4 of 5 series · 23 of 48 positions shown · non-contrast
Comparison: None.

CLINICAL DATA: 71-year-old female with low back pain radiating to
the right hip, buttock and both legs. Lower extremity pain for 2
months with no known injury.

EXAM:
MRI LUMBAR SPINE WITHOUT CONTRAST
TECHNIQUE: Multiplanar, multisequence MR imaging of the lumbar spine was
performed. No intravenous contrast was administered.

[Series 2: T2 · sagittal · 4.0mm · 0.55mm/px · 6 of 18 slices shown (1 of 2)]
[im 1/18]
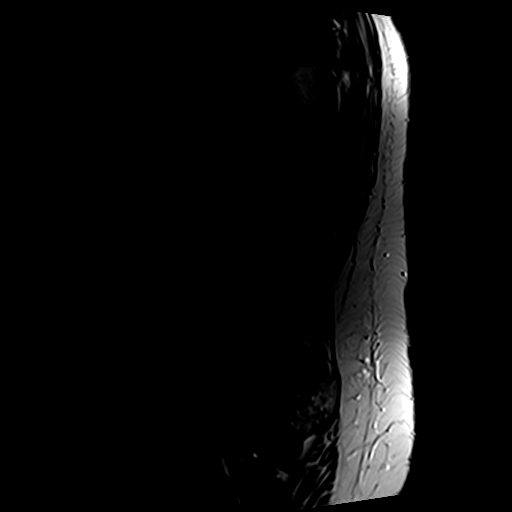
[im 4/18]
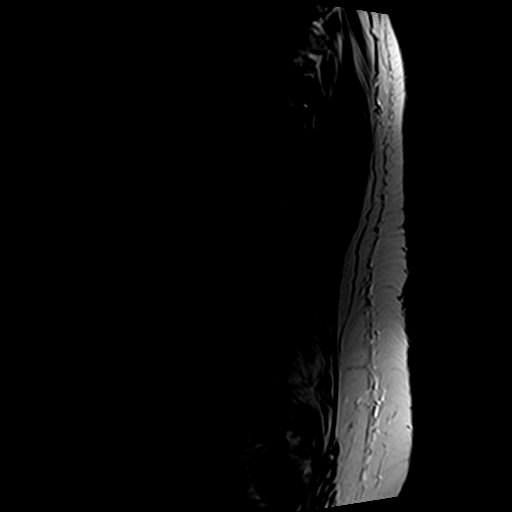
[im 7/18]
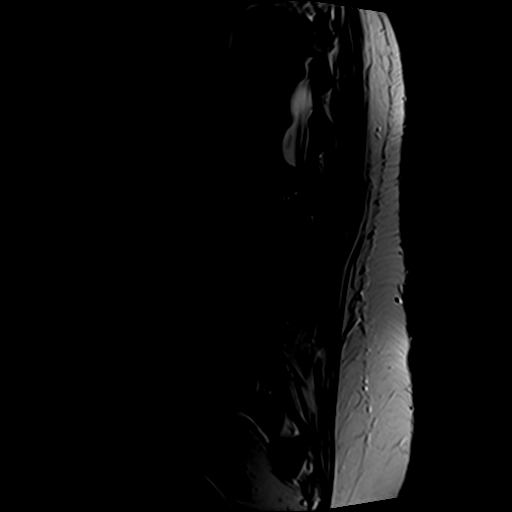
[im 11/18]
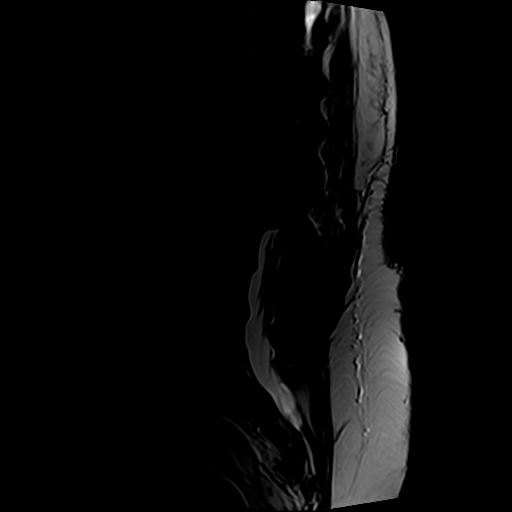
[im 14/18]
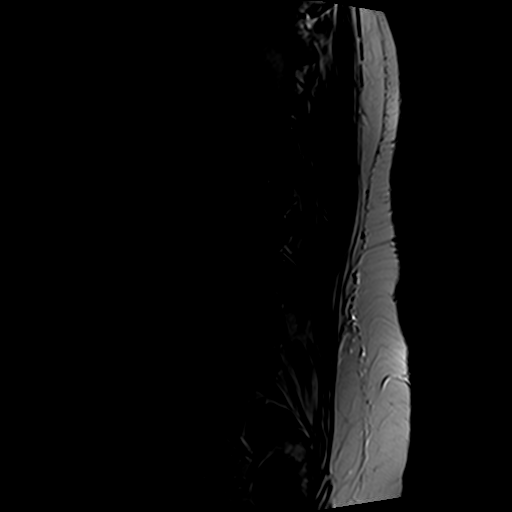
[im 18/18]
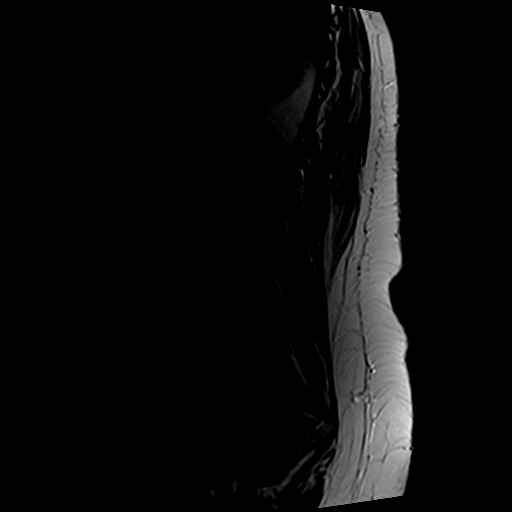

[Series 4: T1 · sagittal · 4.0mm · 0.55mm/px · 6 of 18 slices shown (1 of 2)]
[im 1/18]
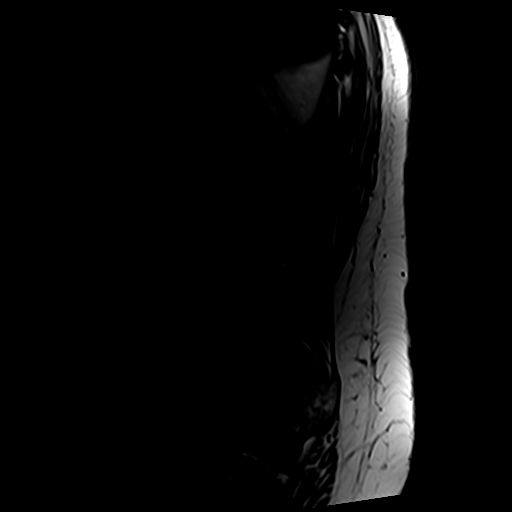
[im 3/18]
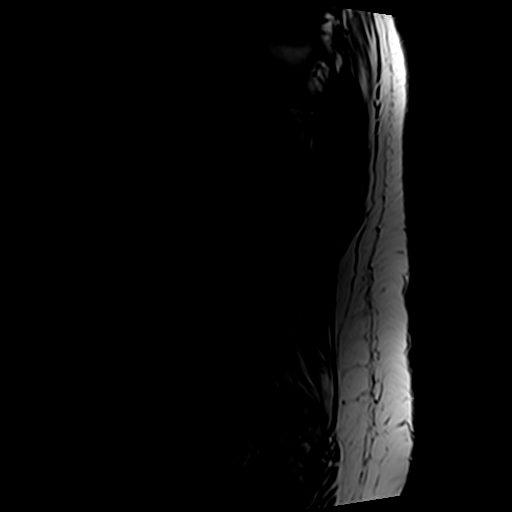
[im 6/18]
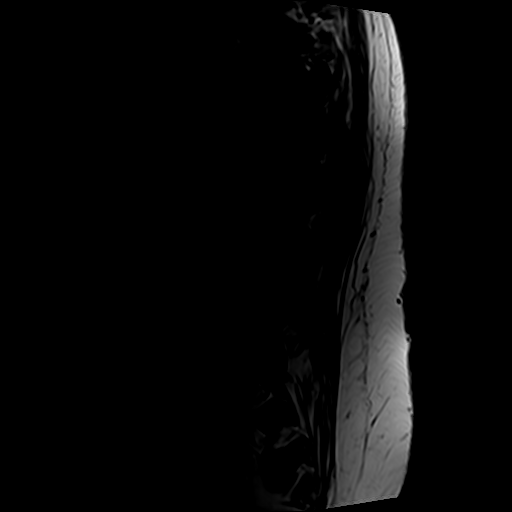
[im 9/18]
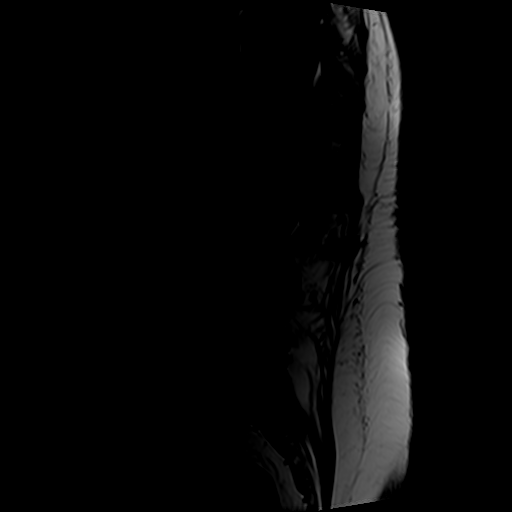
[im 12/18]
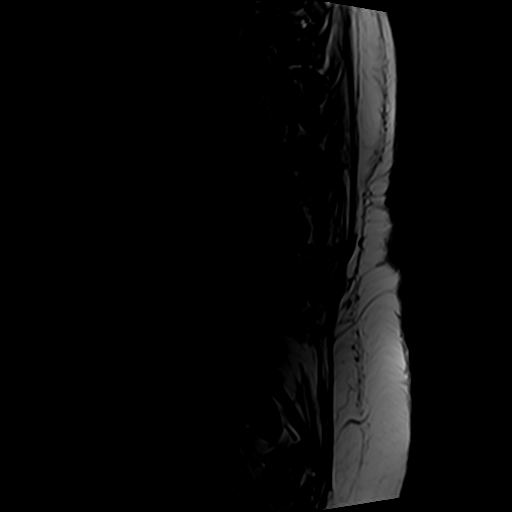
[im 15/18]
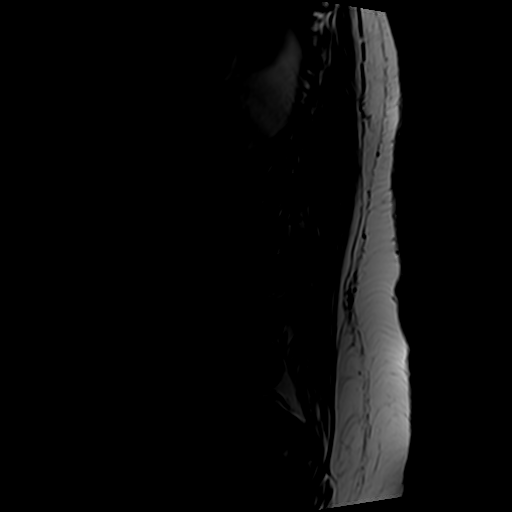

[Series 5: T2 · axial · 4.0mm · 0.70mm/px · z∈[-136,+67]mm · 8 of 37 slices shown (2 of 2)]
[im 1/37]
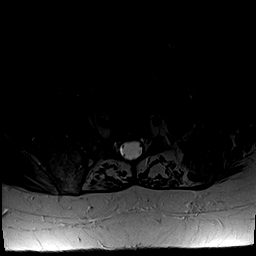
[im 6/37]
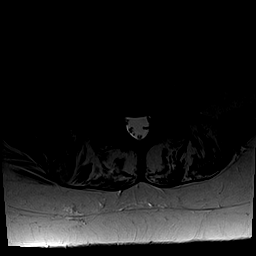
[im 12/37]
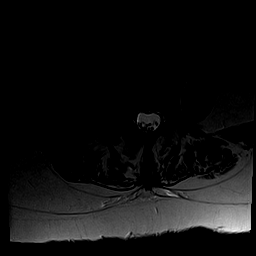
[im 17/37]
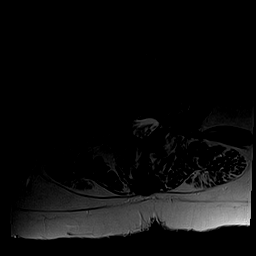
[im 20/37]
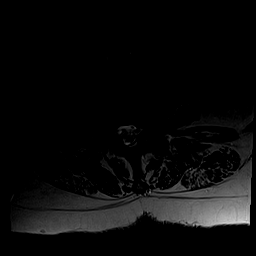
[im 25/37]
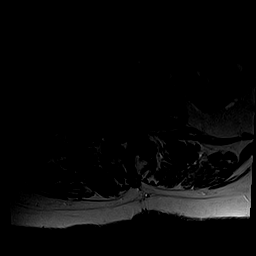
[im 31/37]
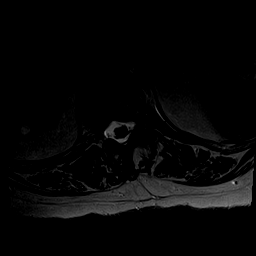
[im 37/37]
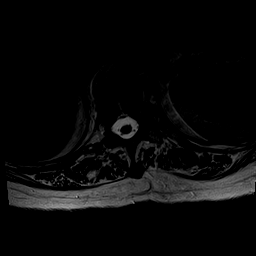

[Series 6: T1 · axial · 4.0mm · 0.35mm/px · z∈[-111,+37]mm · 3 of 37 slices shown (2 of 2)]
[im 6/37]
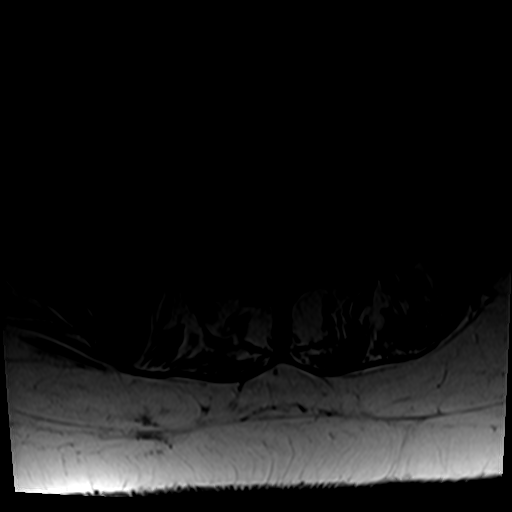
[im 20/37]
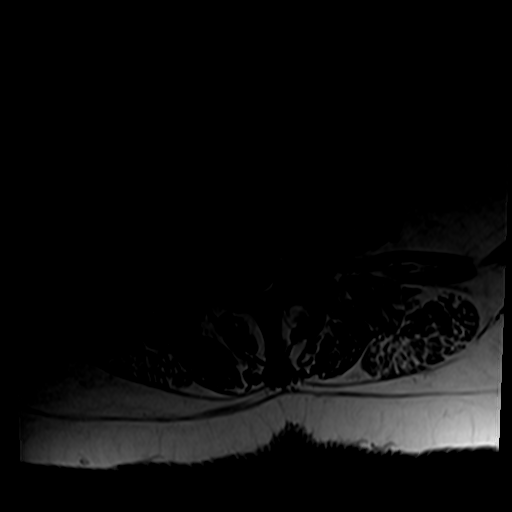
[im 31/37]
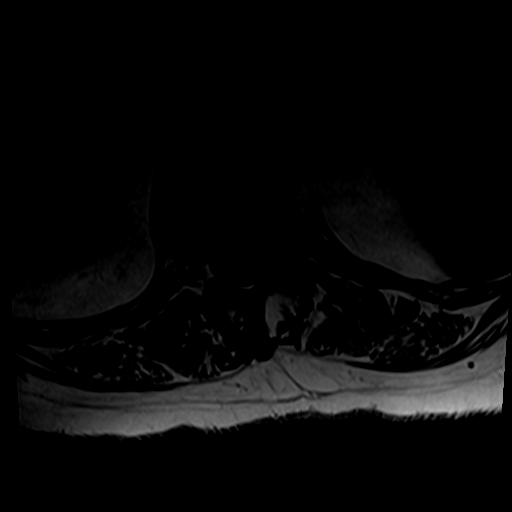

[23 of 48 positions shown; findings below may reference images not displayed]

FINDINGS: Segmentation: Lumbar segmentation appears to be normal and will be
designated as such for this report.

Alignment: Moderate dextroconvex lumbar spine curvature.
Straightening of lumbar lordosis. No significant spondylolisthesis.

Vertebrae: Confluent degenerative appearing marrow edema in the
L1-L2 endplates and L1 vertebral body (series 3, image 8). See
additional details of that level below. Chronic degenerative
endplate marrow signal changes elsewhere in the lower thoracic and
lumbar spine. No other No acute osseous abnormality identified.
Intact visible sacrum and SI joints.

Conus medullaris and cauda equina: Conus extends to the L1-L2 level,
see below. No abnormal signal in the lower thoracic spinal cord.

Paraspinal and other soft tissues: Negative.

Disc levels:

T10-T11: Disc space loss with disc bulge, endplate spurring and
posterior element hypertrophy. Mild to moderate left foraminal
stenosis. No definite spinal stenosis.

T11-T12: Disc space loss with circumferential disc bulge, endplate
spurring and posterior element hypertrophy. Borderline to mild
spinal stenosis. Moderate to severe left and mild right T11
foraminal stenosis.

T12-L1: Disc space loss with mostly far lateral disc bulge and
endplate spurring. Posterior element hypertrophy. Moderate left T12
foraminal stenosis.

L1-L2: Severe disc space loss with bulky circumferential disc
osteophyte complex. Evidence of vacuum disc. Posterior element
hypertrophy and mild epidural lipomatosis. Moderate to severe spinal
stenosis at this level which corresponds to the conus (series 2,
image 8 and series 5, image 13). Moderate bilateral L1 foraminal
stenosis.

L2-L3: Severe disc space loss with bulky circumferential disc
osteophyte complex and mild to moderate posterior element
hypertrophy. Mild spinal stenosis with moderate left lateral recess
stenosis. Moderate to severe left and moderate right L2 foraminal
stenosis.

L3-L4: Severe disc space loss with right eccentric bulky disc
osteophyte complex. Posterior element hypertrophy. Mild right
lateral recess stenosis. Moderate to severe right L3 foraminal
stenosis.

L4-L5: Severe disc space loss with mostly far lateral disc
osteophyte complex. Posterior element hypertrophy. Moderate left and
moderate to severe right L4 foraminal stenosis.

L5-S1: Severe disc space loss with mostly far lateral disc
osteophyte complex, bulky on the right. Moderate facet and ligament
flavum hypertrophy. Severe left and moderate to severe right L5
foraminal stenosis.
IMPRESSION: 1. Dextroconvex lumbar scoliosis with diffuse advanced lumbar disc
and endplate degeneration. Widespread posterior element hypertrophy.
Lower thoracic disc degeneration.
2. Degenerative edema in the L1 and L2 vertebral bodies. No other No
acute osseous abnormality identified.
3. Moderate to severe multifactorial spinal stenosis at L1-L2 which
is the level of the conus medullaris. No conus or lower thoracic
spinal cord signal abnormality.
4. Mild spinal stenosis at L1-L2 and T11-T12.
5. Moderate to severe neural foraminal stenosis at the left T11,
left L2, right L3, right L4, and bilateral L5 nerve levels.

## 2019-10-18 DIAGNOSIS — M5441 Lumbago with sciatica, right side: Secondary | ICD-10-CM | POA: Diagnosis not present

## 2019-10-18 DIAGNOSIS — M9903 Segmental and somatic dysfunction of lumbar region: Secondary | ICD-10-CM | POA: Diagnosis not present

## 2019-10-18 DIAGNOSIS — M62830 Muscle spasm of back: Secondary | ICD-10-CM | POA: Diagnosis not present

## 2019-10-18 DIAGNOSIS — M5136 Other intervertebral disc degeneration, lumbar region: Secondary | ICD-10-CM | POA: Diagnosis not present

## 2019-11-05 DIAGNOSIS — I129 Hypertensive chronic kidney disease with stage 1 through stage 4 chronic kidney disease, or unspecified chronic kidney disease: Secondary | ICD-10-CM | POA: Diagnosis not present

## 2019-11-05 DIAGNOSIS — M1990 Unspecified osteoarthritis, unspecified site: Secondary | ICD-10-CM | POA: Diagnosis not present

## 2019-11-05 DIAGNOSIS — E7800 Pure hypercholesterolemia, unspecified: Secondary | ICD-10-CM | POA: Diagnosis not present

## 2019-11-05 DIAGNOSIS — N183 Chronic kidney disease, stage 3 unspecified: Secondary | ICD-10-CM | POA: Diagnosis not present

## 2019-11-08 DIAGNOSIS — M5136 Other intervertebral disc degeneration, lumbar region: Secondary | ICD-10-CM | POA: Diagnosis not present

## 2019-11-08 DIAGNOSIS — M62830 Muscle spasm of back: Secondary | ICD-10-CM | POA: Diagnosis not present

## 2019-11-08 DIAGNOSIS — M5441 Lumbago with sciatica, right side: Secondary | ICD-10-CM | POA: Diagnosis not present

## 2019-11-08 DIAGNOSIS — M9903 Segmental and somatic dysfunction of lumbar region: Secondary | ICD-10-CM | POA: Diagnosis not present

## 2019-12-03 DIAGNOSIS — Z1231 Encounter for screening mammogram for malignant neoplasm of breast: Secondary | ICD-10-CM | POA: Diagnosis not present

## 2019-12-10 DIAGNOSIS — M19071 Primary osteoarthritis, right ankle and foot: Secondary | ICD-10-CM | POA: Diagnosis not present

## 2019-12-20 DIAGNOSIS — M5136 Other intervertebral disc degeneration, lumbar region: Secondary | ICD-10-CM | POA: Diagnosis not present

## 2019-12-20 DIAGNOSIS — M5441 Lumbago with sciatica, right side: Secondary | ICD-10-CM | POA: Diagnosis not present

## 2019-12-20 DIAGNOSIS — M62830 Muscle spasm of back: Secondary | ICD-10-CM | POA: Diagnosis not present

## 2019-12-20 DIAGNOSIS — M9903 Segmental and somatic dysfunction of lumbar region: Secondary | ICD-10-CM | POA: Diagnosis not present

## 2019-12-21 DIAGNOSIS — H524 Presbyopia: Secondary | ICD-10-CM | POA: Diagnosis not present

## 2020-01-07 DIAGNOSIS — H6983 Other specified disorders of Eustachian tube, bilateral: Secondary | ICD-10-CM | POA: Diagnosis not present

## 2020-01-08 DIAGNOSIS — M5441 Lumbago with sciatica, right side: Secondary | ICD-10-CM | POA: Diagnosis not present

## 2020-01-08 DIAGNOSIS — M62830 Muscle spasm of back: Secondary | ICD-10-CM | POA: Diagnosis not present

## 2020-01-08 DIAGNOSIS — M9903 Segmental and somatic dysfunction of lumbar region: Secondary | ICD-10-CM | POA: Diagnosis not present

## 2020-01-08 DIAGNOSIS — M5136 Other intervertebral disc degeneration, lumbar region: Secondary | ICD-10-CM | POA: Diagnosis not present

## 2020-01-11 DIAGNOSIS — M5136 Other intervertebral disc degeneration, lumbar region: Secondary | ICD-10-CM | POA: Diagnosis not present

## 2020-01-11 DIAGNOSIS — M62830 Muscle spasm of back: Secondary | ICD-10-CM | POA: Diagnosis not present

## 2020-01-11 DIAGNOSIS — M5441 Lumbago with sciatica, right side: Secondary | ICD-10-CM | POA: Diagnosis not present

## 2020-01-11 DIAGNOSIS — M9903 Segmental and somatic dysfunction of lumbar region: Secondary | ICD-10-CM | POA: Diagnosis not present

## 2020-01-14 DIAGNOSIS — I951 Orthostatic hypotension: Secondary | ICD-10-CM | POA: Diagnosis not present

## 2020-01-14 DIAGNOSIS — H6980 Other specified disorders of Eustachian tube, unspecified ear: Secondary | ICD-10-CM | POA: Diagnosis not present

## 2020-01-24 DIAGNOSIS — M9903 Segmental and somatic dysfunction of lumbar region: Secondary | ICD-10-CM | POA: Diagnosis not present

## 2020-01-24 DIAGNOSIS — M5441 Lumbago with sciatica, right side: Secondary | ICD-10-CM | POA: Diagnosis not present

## 2020-01-24 DIAGNOSIS — M62830 Muscle spasm of back: Secondary | ICD-10-CM | POA: Diagnosis not present

## 2020-01-24 DIAGNOSIS — M5136 Other intervertebral disc degeneration, lumbar region: Secondary | ICD-10-CM | POA: Diagnosis not present

## 2020-01-31 DIAGNOSIS — M9903 Segmental and somatic dysfunction of lumbar region: Secondary | ICD-10-CM | POA: Diagnosis not present

## 2020-01-31 DIAGNOSIS — M62830 Muscle spasm of back: Secondary | ICD-10-CM | POA: Diagnosis not present

## 2020-01-31 DIAGNOSIS — M5441 Lumbago with sciatica, right side: Secondary | ICD-10-CM | POA: Diagnosis not present

## 2020-01-31 DIAGNOSIS — M5136 Other intervertebral disc degeneration, lumbar region: Secondary | ICD-10-CM | POA: Diagnosis not present

## 2020-02-01 DIAGNOSIS — M1990 Unspecified osteoarthritis, unspecified site: Secondary | ICD-10-CM | POA: Diagnosis not present

## 2020-02-01 DIAGNOSIS — E7800 Pure hypercholesterolemia, unspecified: Secondary | ICD-10-CM | POA: Diagnosis not present

## 2020-02-01 DIAGNOSIS — M9973 Connective tissue and disc stenosis of intervertebral foramina of lumbar region: Secondary | ICD-10-CM | POA: Diagnosis not present

## 2020-02-01 DIAGNOSIS — I129 Hypertensive chronic kidney disease with stage 1 through stage 4 chronic kidney disease, or unspecified chronic kidney disease: Secondary | ICD-10-CM | POA: Diagnosis not present

## 2020-02-01 DIAGNOSIS — N183 Chronic kidney disease, stage 3 unspecified: Secondary | ICD-10-CM | POA: Diagnosis not present

## 2020-02-12 DIAGNOSIS — M62830 Muscle spasm of back: Secondary | ICD-10-CM | POA: Diagnosis not present

## 2020-02-12 DIAGNOSIS — M9903 Segmental and somatic dysfunction of lumbar region: Secondary | ICD-10-CM | POA: Diagnosis not present

## 2020-02-12 DIAGNOSIS — M5441 Lumbago with sciatica, right side: Secondary | ICD-10-CM | POA: Diagnosis not present

## 2020-02-12 DIAGNOSIS — M5136 Other intervertebral disc degeneration, lumbar region: Secondary | ICD-10-CM | POA: Diagnosis not present

## 2020-02-18 DIAGNOSIS — R69 Illness, unspecified: Secondary | ICD-10-CM | POA: Diagnosis not present

## 2020-02-22 DIAGNOSIS — Z23 Encounter for immunization: Secondary | ICD-10-CM | POA: Diagnosis not present

## 2020-02-22 DIAGNOSIS — Z0000 Encounter for general adult medical examination without abnormal findings: Secondary | ICD-10-CM | POA: Diagnosis not present

## 2020-02-22 DIAGNOSIS — Z1389 Encounter for screening for other disorder: Secondary | ICD-10-CM | POA: Diagnosis not present

## 2020-02-28 DIAGNOSIS — M5136 Other intervertebral disc degeneration, lumbar region: Secondary | ICD-10-CM | POA: Diagnosis not present

## 2020-02-28 DIAGNOSIS — M9903 Segmental and somatic dysfunction of lumbar region: Secondary | ICD-10-CM | POA: Diagnosis not present

## 2020-02-28 DIAGNOSIS — M5441 Lumbago with sciatica, right side: Secondary | ICD-10-CM | POA: Diagnosis not present

## 2020-02-28 DIAGNOSIS — M62830 Muscle spasm of back: Secondary | ICD-10-CM | POA: Diagnosis not present

## 2020-03-05 ENCOUNTER — Encounter: Payer: Self-pay | Admitting: Podiatry

## 2020-03-05 ENCOUNTER — Ambulatory Visit: Payer: Medicare HMO | Admitting: Podiatry

## 2020-03-05 ENCOUNTER — Ambulatory Visit (INDEPENDENT_AMBULATORY_CARE_PROVIDER_SITE_OTHER): Payer: Medicare HMO

## 2020-03-05 ENCOUNTER — Other Ambulatory Visit: Payer: Self-pay | Admitting: Podiatry

## 2020-03-05 ENCOUNTER — Other Ambulatory Visit: Payer: Self-pay

## 2020-03-05 DIAGNOSIS — M779 Enthesopathy, unspecified: Secondary | ICD-10-CM | POA: Diagnosis not present

## 2020-03-05 DIAGNOSIS — M79671 Pain in right foot: Secondary | ICD-10-CM

## 2020-03-05 DIAGNOSIS — M778 Other enthesopathies, not elsewhere classified: Secondary | ICD-10-CM

## 2020-03-05 DIAGNOSIS — M79672 Pain in left foot: Secondary | ICD-10-CM

## 2020-03-05 DIAGNOSIS — M722 Plantar fascial fibromatosis: Secondary | ICD-10-CM

## 2020-03-05 NOTE — Progress Notes (Signed)
Subjective:   Patient ID: Morgan Fitzgerald, female   DOB: 74 y.o.   MRN: [id number]   HPI Patient presents stating that she has pain in her right ankle and her left heel.  States that she had previous injections right which only gave her short relief and Dr. Does want to do fusion but she is very leery of this and on the left patient has had acute heel pain of approximate 1 month duration which is worse when she gets up in the morning after sitting.  States the right keeps her from doing stuff long-term the left is more short-term.  Patient smokes occasional and would like to be more active   Review of Systems  All other systems reviewed and are negative.       Objective:  Physical Exam Vitals and nursing note reviewed.  Constitutional:      Appearance: She is well-developed.  Pulmonary:     Effort: Pulmonary effort is normal.  Musculoskeletal:        General: Normal range of motion.  Skin:    General: Skin is warm.  Neurological:     Mental Status: She is alert.     Neurovascular status intact muscle strength found to be adequate range of motion within normal limits.  Patient has complete collapse medial longitudinal arch right that is bothersome for her and has pain in the sinus tarsi and the left she has exquisite discomfort in the plantar fascia the insertion.  She is flat-footed bilateral but right is worse than the left and appears to be more genetic.  Patient has good digital perfusion well oriented x3 with mild obesity noted     Assessment:  Acute sinus tarsitis with capsulitis right along with plantar fasciitis left with pain     Plan:  H&P reviewed conditions.  I discussed possibility for AFO bracing right prior to considering surgery but today I went ahead and I did sterile prep and I injected the sinus tarsi 3 mg Kenalog 5 mg Xylocaine and advised on seeing how this helps her.  For the left I went ahead today did sterile prep and injected the plantar fascia 3 mg Kenalog 5  mg Xylocaine and applied fascial brace.  Patient will be seen back to recheck is encouraged to call with questions prior to procedure.  Patient may ultimately require fusion right  X-rays indicate that there is severe collapse medial longitudinal arch right with the talus in a plantarflexed position and on the left is found to have small spur collapsed arch no indication of stress fracture

## 2020-03-07 DIAGNOSIS — M5416 Radiculopathy, lumbar region: Secondary | ICD-10-CM | POA: Diagnosis not present

## 2020-03-07 DIAGNOSIS — M47816 Spondylosis without myelopathy or radiculopathy, lumbar region: Secondary | ICD-10-CM | POA: Diagnosis not present

## 2020-03-07 DIAGNOSIS — M9973 Connective tissue and disc stenosis of intervertebral foramina of lumbar region: Secondary | ICD-10-CM | POA: Diagnosis not present

## 2020-03-11 DIAGNOSIS — M5136 Other intervertebral disc degeneration, lumbar region: Secondary | ICD-10-CM | POA: Diagnosis not present

## 2020-03-11 DIAGNOSIS — M62830 Muscle spasm of back: Secondary | ICD-10-CM | POA: Diagnosis not present

## 2020-03-11 DIAGNOSIS — M9903 Segmental and somatic dysfunction of lumbar region: Secondary | ICD-10-CM | POA: Diagnosis not present

## 2020-03-11 DIAGNOSIS — M5441 Lumbago with sciatica, right side: Secondary | ICD-10-CM | POA: Diagnosis not present

## 2020-03-18 DIAGNOSIS — M1712 Unilateral primary osteoarthritis, left knee: Secondary | ICD-10-CM | POA: Diagnosis not present

## 2020-03-24 DIAGNOSIS — E7800 Pure hypercholesterolemia, unspecified: Secondary | ICD-10-CM | POA: Diagnosis not present

## 2020-03-24 DIAGNOSIS — N1831 Chronic kidney disease, stage 3a: Secondary | ICD-10-CM | POA: Diagnosis not present

## 2020-03-24 DIAGNOSIS — Z79899 Other long term (current) drug therapy: Secondary | ICD-10-CM | POA: Diagnosis not present

## 2020-03-24 DIAGNOSIS — E559 Vitamin D deficiency, unspecified: Secondary | ICD-10-CM | POA: Diagnosis not present

## 2020-03-25 DIAGNOSIS — M5136 Other intervertebral disc degeneration, lumbar region: Secondary | ICD-10-CM | POA: Diagnosis not present

## 2020-03-25 DIAGNOSIS — M9903 Segmental and somatic dysfunction of lumbar region: Secondary | ICD-10-CM | POA: Diagnosis not present

## 2020-03-25 DIAGNOSIS — M5441 Lumbago with sciatica, right side: Secondary | ICD-10-CM | POA: Diagnosis not present

## 2020-03-25 DIAGNOSIS — M62830 Muscle spasm of back: Secondary | ICD-10-CM | POA: Diagnosis not present

## 2020-03-26 ENCOUNTER — Ambulatory Visit: Payer: Medicare HMO | Admitting: Podiatry

## 2020-03-28 ENCOUNTER — Other Ambulatory Visit: Payer: Self-pay

## 2020-03-28 ENCOUNTER — Ambulatory Visit: Payer: Medicare HMO | Admitting: Podiatry

## 2020-03-28 ENCOUNTER — Encounter: Payer: Self-pay | Admitting: Podiatry

## 2020-03-28 DIAGNOSIS — M76821 Posterior tibial tendinitis, right leg: Secondary | ICD-10-CM | POA: Diagnosis not present

## 2020-03-28 DIAGNOSIS — Z6835 Body mass index (BMI) 35.0-35.9, adult: Secondary | ICD-10-CM | POA: Diagnosis not present

## 2020-03-28 DIAGNOSIS — R69 Illness, unspecified: Secondary | ICD-10-CM | POA: Diagnosis not present

## 2020-03-28 DIAGNOSIS — N183 Chronic kidney disease, stage 3 unspecified: Secondary | ICD-10-CM | POA: Diagnosis not present

## 2020-03-28 DIAGNOSIS — G894 Chronic pain syndrome: Secondary | ICD-10-CM | POA: Diagnosis not present

## 2020-03-28 DIAGNOSIS — E7800 Pure hypercholesterolemia, unspecified: Secondary | ICD-10-CM | POA: Diagnosis not present

## 2020-03-28 DIAGNOSIS — E559 Vitamin D deficiency, unspecified: Secondary | ICD-10-CM | POA: Diagnosis not present

## 2020-03-28 DIAGNOSIS — I129 Hypertensive chronic kidney disease with stage 1 through stage 4 chronic kidney disease, or unspecified chronic kidney disease: Secondary | ICD-10-CM | POA: Diagnosis not present

## 2020-03-28 DIAGNOSIS — M549 Dorsalgia, unspecified: Secondary | ICD-10-CM | POA: Diagnosis not present

## 2020-03-28 DIAGNOSIS — Z0100 Encounter for examination of eyes and vision without abnormal findings: Secondary | ICD-10-CM | POA: Diagnosis not present

## 2020-03-28 DIAGNOSIS — M722 Plantar fascial fibromatosis: Secondary | ICD-10-CM | POA: Diagnosis not present

## 2020-03-29 NOTE — Progress Notes (Signed)
Subjective:   Patient ID: Morgan Fitzgerald, female   DOB: 74 y.o.   MRN: [id number]   HPI Patient states she is improved but she still having a lot of pain in the bottom of her left heel stating it is worse when she gets up in the morning and after periods of sitting   ROS      Objective:  Physical Exam  Neurovascular status intact with patient's left plantar fascia found to be inflamed at insertion localized with swelling associated with it     Assessment:  Acute plantar fasciitis left improved but still very tender     Plan:  H&P reviewed condition and recommended continued conservative care and sterile prep done injected the fascia 3 mg Kenalog 5 mg Xylocaine and applied sterile dressing.  Dispensed night splint to sleep in and to use in the evening and patient will be seen back as needed

## 2020-03-31 ENCOUNTER — Ambulatory Visit: Payer: Medicare HMO | Admitting: Podiatry

## 2020-04-03 DIAGNOSIS — M5136 Other intervertebral disc degeneration, lumbar region: Secondary | ICD-10-CM | POA: Diagnosis not present

## 2020-04-03 DIAGNOSIS — M9903 Segmental and somatic dysfunction of lumbar region: Secondary | ICD-10-CM | POA: Diagnosis not present

## 2020-04-03 DIAGNOSIS — M62830 Muscle spasm of back: Secondary | ICD-10-CM | POA: Diagnosis not present

## 2020-04-03 DIAGNOSIS — M5441 Lumbago with sciatica, right side: Secondary | ICD-10-CM | POA: Diagnosis not present

## 2020-05-01 DIAGNOSIS — M62830 Muscle spasm of back: Secondary | ICD-10-CM | POA: Diagnosis not present

## 2020-05-01 DIAGNOSIS — M9903 Segmental and somatic dysfunction of lumbar region: Secondary | ICD-10-CM | POA: Diagnosis not present

## 2020-05-01 DIAGNOSIS — M5441 Lumbago with sciatica, right side: Secondary | ICD-10-CM | POA: Diagnosis not present

## 2020-05-01 DIAGNOSIS — M5136 Other intervertebral disc degeneration, lumbar region: Secondary | ICD-10-CM | POA: Diagnosis not present

## 2020-05-06 ENCOUNTER — Other Ambulatory Visit: Payer: Medicare HMO | Admitting: Orthotics

## 2020-05-15 DIAGNOSIS — M5136 Other intervertebral disc degeneration, lumbar region: Secondary | ICD-10-CM | POA: Diagnosis not present

## 2020-05-15 DIAGNOSIS — H02834 Dermatochalasis of left upper eyelid: Secondary | ICD-10-CM | POA: Diagnosis not present

## 2020-05-15 DIAGNOSIS — H02831 Dermatochalasis of right upper eyelid: Secondary | ICD-10-CM | POA: Diagnosis not present

## 2020-05-15 DIAGNOSIS — M62830 Muscle spasm of back: Secondary | ICD-10-CM | POA: Diagnosis not present

## 2020-05-15 DIAGNOSIS — H02423 Myogenic ptosis of bilateral eyelids: Secondary | ICD-10-CM | POA: Diagnosis not present

## 2020-05-15 DIAGNOSIS — H57813 Brow ptosis, bilateral: Secondary | ICD-10-CM | POA: Diagnosis not present

## 2020-05-15 DIAGNOSIS — M5441 Lumbago with sciatica, right side: Secondary | ICD-10-CM | POA: Diagnosis not present

## 2020-05-15 DIAGNOSIS — M9903 Segmental and somatic dysfunction of lumbar region: Secondary | ICD-10-CM | POA: Diagnosis not present

## 2020-05-15 DIAGNOSIS — H0279 Other degenerative disorders of eyelid and periocular area: Secondary | ICD-10-CM | POA: Diagnosis not present

## 2020-05-15 DIAGNOSIS — H53483 Generalized contraction of visual field, bilateral: Secondary | ICD-10-CM | POA: Diagnosis not present

## 2020-05-26 DIAGNOSIS — H53481 Generalized contraction of visual field, right eye: Secondary | ICD-10-CM | POA: Diagnosis not present

## 2020-05-26 DIAGNOSIS — H53483 Generalized contraction of visual field, bilateral: Secondary | ICD-10-CM | POA: Diagnosis not present

## 2020-05-26 DIAGNOSIS — H53482 Generalized contraction of visual field, left eye: Secondary | ICD-10-CM | POA: Diagnosis not present

## 2020-06-05 DIAGNOSIS — M5441 Lumbago with sciatica, right side: Secondary | ICD-10-CM | POA: Diagnosis not present

## 2020-06-05 DIAGNOSIS — M62830 Muscle spasm of back: Secondary | ICD-10-CM | POA: Diagnosis not present

## 2020-06-05 DIAGNOSIS — M9903 Segmental and somatic dysfunction of lumbar region: Secondary | ICD-10-CM | POA: Diagnosis not present

## 2020-06-05 DIAGNOSIS — M5136 Other intervertebral disc degeneration, lumbar region: Secondary | ICD-10-CM | POA: Diagnosis not present

## 2020-06-06 DIAGNOSIS — M19071 Primary osteoarthritis, right ankle and foot: Secondary | ICD-10-CM | POA: Diagnosis not present

## 2020-06-09 ENCOUNTER — Other Ambulatory Visit: Payer: Medicare HMO | Admitting: Orthotics

## 2020-06-20 DIAGNOSIS — M62830 Muscle spasm of back: Secondary | ICD-10-CM | POA: Diagnosis not present

## 2020-06-20 DIAGNOSIS — M5441 Lumbago with sciatica, right side: Secondary | ICD-10-CM | POA: Diagnosis not present

## 2020-06-20 DIAGNOSIS — M5136 Other intervertebral disc degeneration, lumbar region: Secondary | ICD-10-CM | POA: Diagnosis not present

## 2020-06-20 DIAGNOSIS — M9903 Segmental and somatic dysfunction of lumbar region: Secondary | ICD-10-CM | POA: Diagnosis not present

## 2020-06-23 ENCOUNTER — Other Ambulatory Visit: Payer: Medicare HMO

## 2020-07-11 ENCOUNTER — Other Ambulatory Visit: Payer: Medicare HMO

## 2020-07-24 DIAGNOSIS — M62830 Muscle spasm of back: Secondary | ICD-10-CM | POA: Diagnosis not present

## 2020-07-24 DIAGNOSIS — M5136 Other intervertebral disc degeneration, lumbar region: Secondary | ICD-10-CM | POA: Diagnosis not present

## 2020-07-24 DIAGNOSIS — M9903 Segmental and somatic dysfunction of lumbar region: Secondary | ICD-10-CM | POA: Diagnosis not present

## 2020-07-24 DIAGNOSIS — M5441 Lumbago with sciatica, right side: Secondary | ICD-10-CM | POA: Diagnosis not present

## 2020-08-12 DIAGNOSIS — M62830 Muscle spasm of back: Secondary | ICD-10-CM | POA: Diagnosis not present

## 2020-08-12 DIAGNOSIS — M5136 Other intervertebral disc degeneration, lumbar region: Secondary | ICD-10-CM | POA: Diagnosis not present

## 2020-08-12 DIAGNOSIS — M9903 Segmental and somatic dysfunction of lumbar region: Secondary | ICD-10-CM | POA: Diagnosis not present

## 2020-08-12 DIAGNOSIS — M5441 Lumbago with sciatica, right side: Secondary | ICD-10-CM | POA: Diagnosis not present

## 2020-08-27 DIAGNOSIS — M4312 Spondylolisthesis, cervical region: Secondary | ICD-10-CM | POA: Diagnosis not present

## 2020-08-27 DIAGNOSIS — I700 Atherosclerosis of aorta: Secondary | ICD-10-CM | POA: Diagnosis not present

## 2020-08-27 DIAGNOSIS — M4726 Other spondylosis with radiculopathy, lumbar region: Secondary | ICD-10-CM | POA: Diagnosis not present

## 2020-08-27 DIAGNOSIS — M47819 Spondylosis without myelopathy or radiculopathy, site unspecified: Secondary | ICD-10-CM | POA: Diagnosis not present

## 2020-08-27 DIAGNOSIS — M5416 Radiculopathy, lumbar region: Secondary | ICD-10-CM | POA: Diagnosis not present

## 2020-08-27 DIAGNOSIS — M48061 Spinal stenosis, lumbar region without neurogenic claudication: Secondary | ICD-10-CM | POA: Diagnosis not present

## 2020-09-02 DIAGNOSIS — M5441 Lumbago with sciatica, right side: Secondary | ICD-10-CM | POA: Diagnosis not present

## 2020-09-02 DIAGNOSIS — M5136 Other intervertebral disc degeneration, lumbar region: Secondary | ICD-10-CM | POA: Diagnosis not present

## 2020-09-02 DIAGNOSIS — M9903 Segmental and somatic dysfunction of lumbar region: Secondary | ICD-10-CM | POA: Diagnosis not present

## 2020-09-02 DIAGNOSIS — M62830 Muscle spasm of back: Secondary | ICD-10-CM | POA: Diagnosis not present

## 2020-09-19 DIAGNOSIS — M62830 Muscle spasm of back: Secondary | ICD-10-CM | POA: Diagnosis not present

## 2020-09-19 DIAGNOSIS — M5441 Lumbago with sciatica, right side: Secondary | ICD-10-CM | POA: Diagnosis not present

## 2020-09-19 DIAGNOSIS — M5136 Other intervertebral disc degeneration, lumbar region: Secondary | ICD-10-CM | POA: Diagnosis not present

## 2020-09-19 DIAGNOSIS — M9903 Segmental and somatic dysfunction of lumbar region: Secondary | ICD-10-CM | POA: Diagnosis not present

## 2020-09-26 DIAGNOSIS — N184 Chronic kidney disease, stage 4 (severe): Secondary | ICD-10-CM | POA: Diagnosis not present

## 2020-09-26 DIAGNOSIS — E7800 Pure hypercholesterolemia, unspecified: Secondary | ICD-10-CM | POA: Diagnosis not present

## 2020-09-26 DIAGNOSIS — N183 Chronic kidney disease, stage 3 unspecified: Secondary | ICD-10-CM | POA: Diagnosis not present

## 2020-09-26 DIAGNOSIS — M1712 Unilateral primary osteoarthritis, left knee: Secondary | ICD-10-CM | POA: Diagnosis not present

## 2020-09-26 DIAGNOSIS — M1990 Unspecified osteoarthritis, unspecified site: Secondary | ICD-10-CM | POA: Diagnosis not present

## 2020-09-26 DIAGNOSIS — I129 Hypertensive chronic kidney disease with stage 1 through stage 4 chronic kidney disease, or unspecified chronic kidney disease: Secondary | ICD-10-CM | POA: Diagnosis not present

## 2020-10-02 DIAGNOSIS — M9903 Segmental and somatic dysfunction of lumbar region: Secondary | ICD-10-CM | POA: Diagnosis not present

## 2020-10-02 DIAGNOSIS — M62830 Muscle spasm of back: Secondary | ICD-10-CM | POA: Diagnosis not present

## 2020-10-02 DIAGNOSIS — M5441 Lumbago with sciatica, right side: Secondary | ICD-10-CM | POA: Diagnosis not present

## 2020-10-02 DIAGNOSIS — M5136 Other intervertebral disc degeneration, lumbar region: Secondary | ICD-10-CM | POA: Diagnosis not present

## 2020-10-08 DIAGNOSIS — I129 Hypertensive chronic kidney disease with stage 1 through stage 4 chronic kidney disease, or unspecified chronic kidney disease: Secondary | ICD-10-CM | POA: Diagnosis not present

## 2020-10-08 DIAGNOSIS — E6609 Other obesity due to excess calories: Secondary | ICD-10-CM | POA: Diagnosis not present

## 2020-10-08 DIAGNOSIS — L814 Other melanin hyperpigmentation: Secondary | ICD-10-CM | POA: Diagnosis not present

## 2020-10-08 DIAGNOSIS — M19071 Primary osteoarthritis, right ankle and foot: Secondary | ICD-10-CM | POA: Diagnosis not present

## 2020-10-08 DIAGNOSIS — M25571 Pain in right ankle and joints of right foot: Secondary | ICD-10-CM | POA: Diagnosis not present

## 2020-10-08 DIAGNOSIS — M1990 Unspecified osteoarthritis, unspecified site: Secondary | ICD-10-CM | POA: Diagnosis not present

## 2020-10-08 DIAGNOSIS — L820 Inflamed seborrheic keratosis: Secondary | ICD-10-CM | POA: Diagnosis not present

## 2020-10-08 DIAGNOSIS — N184 Chronic kidney disease, stage 4 (severe): Secondary | ICD-10-CM | POA: Diagnosis not present

## 2020-10-08 DIAGNOSIS — D225 Melanocytic nevi of trunk: Secondary | ICD-10-CM | POA: Diagnosis not present

## 2020-10-08 DIAGNOSIS — Z85828 Personal history of other malignant neoplasm of skin: Secondary | ICD-10-CM | POA: Diagnosis not present

## 2020-10-08 DIAGNOSIS — E7800 Pure hypercholesterolemia, unspecified: Secondary | ICD-10-CM | POA: Diagnosis not present

## 2020-10-08 DIAGNOSIS — L918 Other hypertrophic disorders of the skin: Secondary | ICD-10-CM | POA: Diagnosis not present

## 2020-10-08 DIAGNOSIS — L570 Actinic keratosis: Secondary | ICD-10-CM | POA: Diagnosis not present

## 2020-10-08 DIAGNOSIS — Z6834 Body mass index (BMI) 34.0-34.9, adult: Secondary | ICD-10-CM | POA: Diagnosis not present

## 2020-10-08 DIAGNOSIS — L821 Other seborrheic keratosis: Secondary | ICD-10-CM | POA: Diagnosis not present

## 2020-11-05 DIAGNOSIS — M79671 Pain in right foot: Secondary | ICD-10-CM | POA: Diagnosis not present

## 2020-11-05 DIAGNOSIS — R69 Illness, unspecified: Secondary | ICD-10-CM | POA: Diagnosis not present

## 2020-11-05 DIAGNOSIS — M6701 Short Achilles tendon (acquired), right ankle: Secondary | ICD-10-CM | POA: Diagnosis not present

## 2020-11-05 DIAGNOSIS — M67873 Other specified disorders of tendon, right ankle and foot: Secondary | ICD-10-CM | POA: Diagnosis not present

## 2020-11-18 DIAGNOSIS — M9903 Segmental and somatic dysfunction of lumbar region: Secondary | ICD-10-CM | POA: Diagnosis not present

## 2020-11-18 DIAGNOSIS — M5441 Lumbago with sciatica, right side: Secondary | ICD-10-CM | POA: Diagnosis not present

## 2020-11-18 DIAGNOSIS — M62830 Muscle spasm of back: Secondary | ICD-10-CM | POA: Diagnosis not present

## 2020-11-18 DIAGNOSIS — M5136 Other intervertebral disc degeneration, lumbar region: Secondary | ICD-10-CM | POA: Diagnosis not present

## 2020-11-26 DIAGNOSIS — H53483 Generalized contraction of visual field, bilateral: Secondary | ICD-10-CM | POA: Diagnosis not present

## 2020-11-26 DIAGNOSIS — H02423 Myogenic ptosis of bilateral eyelids: Secondary | ICD-10-CM | POA: Diagnosis not present

## 2020-11-26 DIAGNOSIS — H57813 Brow ptosis, bilateral: Secondary | ICD-10-CM | POA: Diagnosis not present

## 2020-11-26 DIAGNOSIS — H02834 Dermatochalasis of left upper eyelid: Secondary | ICD-10-CM | POA: Diagnosis not present

## 2020-11-26 DIAGNOSIS — H02831 Dermatochalasis of right upper eyelid: Secondary | ICD-10-CM | POA: Diagnosis not present

## 2020-12-03 DIAGNOSIS — Z1231 Encounter for screening mammogram for malignant neoplasm of breast: Secondary | ICD-10-CM | POA: Diagnosis not present

## 2021-01-06 DIAGNOSIS — M76821 Posterior tibial tendinitis, right leg: Secondary | ICD-10-CM | POA: Diagnosis not present

## 2021-01-15 DIAGNOSIS — M9903 Segmental and somatic dysfunction of lumbar region: Secondary | ICD-10-CM | POA: Diagnosis not present

## 2021-01-15 DIAGNOSIS — M62830 Muscle spasm of back: Secondary | ICD-10-CM | POA: Diagnosis not present

## 2021-01-15 DIAGNOSIS — M5441 Lumbago with sciatica, right side: Secondary | ICD-10-CM | POA: Diagnosis not present

## 2021-01-15 DIAGNOSIS — M5136 Other intervertebral disc degeneration, lumbar region: Secondary | ICD-10-CM | POA: Diagnosis not present

## 2021-01-16 DIAGNOSIS — H52223 Regular astigmatism, bilateral: Secondary | ICD-10-CM | POA: Diagnosis not present

## 2021-01-29 DIAGNOSIS — M5136 Other intervertebral disc degeneration, lumbar region: Secondary | ICD-10-CM | POA: Diagnosis not present

## 2021-01-29 DIAGNOSIS — M9903 Segmental and somatic dysfunction of lumbar region: Secondary | ICD-10-CM | POA: Diagnosis not present

## 2021-01-29 DIAGNOSIS — M62830 Muscle spasm of back: Secondary | ICD-10-CM | POA: Diagnosis not present

## 2021-01-29 DIAGNOSIS — M5441 Lumbago with sciatica, right side: Secondary | ICD-10-CM | POA: Diagnosis not present

## 2021-02-17 DIAGNOSIS — M5441 Lumbago with sciatica, right side: Secondary | ICD-10-CM | POA: Diagnosis not present

## 2021-02-17 DIAGNOSIS — M5136 Other intervertebral disc degeneration, lumbar region: Secondary | ICD-10-CM | POA: Diagnosis not present

## 2021-02-17 DIAGNOSIS — M62830 Muscle spasm of back: Secondary | ICD-10-CM | POA: Diagnosis not present

## 2021-02-17 DIAGNOSIS — M9903 Segmental and somatic dysfunction of lumbar region: Secondary | ICD-10-CM | POA: Diagnosis not present

## 2021-02-26 DIAGNOSIS — H02423 Myogenic ptosis of bilateral eyelids: Secondary | ICD-10-CM | POA: Diagnosis not present

## 2021-02-26 DIAGNOSIS — H57813 Brow ptosis, bilateral: Secondary | ICD-10-CM | POA: Diagnosis not present

## 2021-02-26 DIAGNOSIS — H02834 Dermatochalasis of left upper eyelid: Secondary | ICD-10-CM | POA: Diagnosis not present

## 2021-02-26 DIAGNOSIS — H02831 Dermatochalasis of right upper eyelid: Secondary | ICD-10-CM | POA: Diagnosis not present

## 2021-02-26 DIAGNOSIS — H04123 Dry eye syndrome of bilateral lacrimal glands: Secondary | ICD-10-CM | POA: Diagnosis not present

## 2021-02-27 DIAGNOSIS — E559 Vitamin D deficiency, unspecified: Secondary | ICD-10-CM | POA: Diagnosis not present

## 2021-02-27 DIAGNOSIS — E7800 Pure hypercholesterolemia, unspecified: Secondary | ICD-10-CM | POA: Diagnosis not present

## 2021-02-27 DIAGNOSIS — I129 Hypertensive chronic kidney disease with stage 1 through stage 4 chronic kidney disease, or unspecified chronic kidney disease: Secondary | ICD-10-CM | POA: Diagnosis not present

## 2021-03-03 DIAGNOSIS — M5441 Lumbago with sciatica, right side: Secondary | ICD-10-CM | POA: Diagnosis not present

## 2021-03-03 DIAGNOSIS — M9903 Segmental and somatic dysfunction of lumbar region: Secondary | ICD-10-CM | POA: Diagnosis not present

## 2021-03-03 DIAGNOSIS — M5136 Other intervertebral disc degeneration, lumbar region: Secondary | ICD-10-CM | POA: Diagnosis not present

## 2021-03-03 DIAGNOSIS — M62830 Muscle spasm of back: Secondary | ICD-10-CM | POA: Diagnosis not present

## 2021-03-06 DIAGNOSIS — I129 Hypertensive chronic kidney disease with stage 1 through stage 4 chronic kidney disease, or unspecified chronic kidney disease: Secondary | ICD-10-CM | POA: Diagnosis not present

## 2021-03-06 DIAGNOSIS — G894 Chronic pain syndrome: Secondary | ICD-10-CM | POA: Diagnosis not present

## 2021-03-06 DIAGNOSIS — E7800 Pure hypercholesterolemia, unspecified: Secondary | ICD-10-CM | POA: Diagnosis not present

## 2021-03-06 DIAGNOSIS — N183 Chronic kidney disease, stage 3 unspecified: Secondary | ICD-10-CM | POA: Diagnosis not present

## 2021-03-06 DIAGNOSIS — R69 Illness, unspecified: Secondary | ICD-10-CM | POA: Diagnosis not present

## 2021-03-11 DIAGNOSIS — H53481 Generalized contraction of visual field, right eye: Secondary | ICD-10-CM | POA: Diagnosis not present

## 2021-03-11 DIAGNOSIS — H53483 Generalized contraction of visual field, bilateral: Secondary | ICD-10-CM | POA: Diagnosis not present

## 2021-03-11 DIAGNOSIS — H53482 Generalized contraction of visual field, left eye: Secondary | ICD-10-CM | POA: Diagnosis not present

## 2021-03-17 DIAGNOSIS — M5441 Lumbago with sciatica, right side: Secondary | ICD-10-CM | POA: Diagnosis not present

## 2021-03-17 DIAGNOSIS — M9903 Segmental and somatic dysfunction of lumbar region: Secondary | ICD-10-CM | POA: Diagnosis not present

## 2021-03-17 DIAGNOSIS — M62830 Muscle spasm of back: Secondary | ICD-10-CM | POA: Diagnosis not present

## 2021-03-17 DIAGNOSIS — M5136 Other intervertebral disc degeneration, lumbar region: Secondary | ICD-10-CM | POA: Diagnosis not present

## 2021-03-18 DIAGNOSIS — Z0000 Encounter for general adult medical examination without abnormal findings: Secondary | ICD-10-CM | POA: Diagnosis not present

## 2021-03-18 DIAGNOSIS — Z1389 Encounter for screening for other disorder: Secondary | ICD-10-CM | POA: Diagnosis not present

## 2021-03-26 DIAGNOSIS — M5136 Other intervertebral disc degeneration, lumbar region: Secondary | ICD-10-CM | POA: Diagnosis not present

## 2021-03-26 DIAGNOSIS — M62830 Muscle spasm of back: Secondary | ICD-10-CM | POA: Diagnosis not present

## 2021-03-26 DIAGNOSIS — M5441 Lumbago with sciatica, right side: Secondary | ICD-10-CM | POA: Diagnosis not present

## 2021-03-26 DIAGNOSIS — M9903 Segmental and somatic dysfunction of lumbar region: Secondary | ICD-10-CM | POA: Diagnosis not present

## 2021-04-14 DIAGNOSIS — M62830 Muscle spasm of back: Secondary | ICD-10-CM | POA: Diagnosis not present

## 2021-04-14 DIAGNOSIS — M5136 Other intervertebral disc degeneration, lumbar region: Secondary | ICD-10-CM | POA: Diagnosis not present

## 2021-04-14 DIAGNOSIS — M5441 Lumbago with sciatica, right side: Secondary | ICD-10-CM | POA: Diagnosis not present

## 2021-04-14 DIAGNOSIS — M9903 Segmental and somatic dysfunction of lumbar region: Secondary | ICD-10-CM | POA: Diagnosis not present

## 2021-04-15 DIAGNOSIS — H02834 Dermatochalasis of left upper eyelid: Secondary | ICD-10-CM | POA: Diagnosis not present

## 2021-04-15 DIAGNOSIS — H53483 Generalized contraction of visual field, bilateral: Secondary | ICD-10-CM | POA: Diagnosis not present

## 2021-04-15 DIAGNOSIS — H02831 Dermatochalasis of right upper eyelid: Secondary | ICD-10-CM | POA: Diagnosis not present

## 2021-04-28 DIAGNOSIS — M62830 Muscle spasm of back: Secondary | ICD-10-CM | POA: Diagnosis not present

## 2021-04-28 DIAGNOSIS — M9903 Segmental and somatic dysfunction of lumbar region: Secondary | ICD-10-CM | POA: Diagnosis not present

## 2021-04-28 DIAGNOSIS — M5441 Lumbago with sciatica, right side: Secondary | ICD-10-CM | POA: Diagnosis not present

## 2021-04-28 DIAGNOSIS — M19071 Primary osteoarthritis, right ankle and foot: Secondary | ICD-10-CM | POA: Diagnosis not present

## 2021-04-28 DIAGNOSIS — M5136 Other intervertebral disc degeneration, lumbar region: Secondary | ICD-10-CM | POA: Diagnosis not present

## 2021-04-28 DIAGNOSIS — M19072 Primary osteoarthritis, left ankle and foot: Secondary | ICD-10-CM | POA: Diagnosis not present

## 2021-05-19 DIAGNOSIS — M9903 Segmental and somatic dysfunction of lumbar region: Secondary | ICD-10-CM | POA: Diagnosis not present

## 2021-05-19 DIAGNOSIS — M5441 Lumbago with sciatica, right side: Secondary | ICD-10-CM | POA: Diagnosis not present

## 2021-05-19 DIAGNOSIS — M62830 Muscle spasm of back: Secondary | ICD-10-CM | POA: Diagnosis not present

## 2021-05-19 DIAGNOSIS — M5136 Other intervertebral disc degeneration, lumbar region: Secondary | ICD-10-CM | POA: Diagnosis not present

## 2021-05-29 DIAGNOSIS — M25571 Pain in right ankle and joints of right foot: Secondary | ICD-10-CM | POA: Diagnosis not present

## 2021-05-29 DIAGNOSIS — M6701 Short Achilles tendon (acquired), right ankle: Secondary | ICD-10-CM | POA: Diagnosis not present

## 2021-05-29 DIAGNOSIS — M19079 Primary osteoarthritis, unspecified ankle and foot: Secondary | ICD-10-CM | POA: Diagnosis not present

## 2021-05-29 DIAGNOSIS — M79671 Pain in right foot: Secondary | ICD-10-CM | POA: Diagnosis not present

## 2021-06-11 DIAGNOSIS — M5441 Lumbago with sciatica, right side: Secondary | ICD-10-CM | POA: Diagnosis not present

## 2021-06-11 DIAGNOSIS — M5136 Other intervertebral disc degeneration, lumbar region: Secondary | ICD-10-CM | POA: Diagnosis not present

## 2021-06-11 DIAGNOSIS — M9903 Segmental and somatic dysfunction of lumbar region: Secondary | ICD-10-CM | POA: Diagnosis not present

## 2021-06-11 DIAGNOSIS — M62830 Muscle spasm of back: Secondary | ICD-10-CM | POA: Diagnosis not present

## 2021-06-25 DIAGNOSIS — M5136 Other intervertebral disc degeneration, lumbar region: Secondary | ICD-10-CM | POA: Diagnosis not present

## 2021-06-25 DIAGNOSIS — M5441 Lumbago with sciatica, right side: Secondary | ICD-10-CM | POA: Diagnosis not present

## 2021-06-25 DIAGNOSIS — M62830 Muscle spasm of back: Secondary | ICD-10-CM | POA: Diagnosis not present

## 2021-06-25 DIAGNOSIS — M9903 Segmental and somatic dysfunction of lumbar region: Secondary | ICD-10-CM | POA: Diagnosis not present

## 2021-07-09 DIAGNOSIS — M5441 Lumbago with sciatica, right side: Secondary | ICD-10-CM | POA: Diagnosis not present

## 2021-07-09 DIAGNOSIS — M9903 Segmental and somatic dysfunction of lumbar region: Secondary | ICD-10-CM | POA: Diagnosis not present

## 2021-07-09 DIAGNOSIS — M5136 Other intervertebral disc degeneration, lumbar region: Secondary | ICD-10-CM | POA: Diagnosis not present

## 2021-07-09 DIAGNOSIS — M62830 Muscle spasm of back: Secondary | ICD-10-CM | POA: Diagnosis not present

## 2021-07-09 DIAGNOSIS — Z09 Encounter for follow-up examination after completed treatment for conditions other than malignant neoplasm: Secondary | ICD-10-CM | POA: Diagnosis not present

## 2021-07-23 DIAGNOSIS — M62830 Muscle spasm of back: Secondary | ICD-10-CM | POA: Diagnosis not present

## 2021-07-23 DIAGNOSIS — M5441 Lumbago with sciatica, right side: Secondary | ICD-10-CM | POA: Diagnosis not present

## 2021-07-23 DIAGNOSIS — M5136 Other intervertebral disc degeneration, lumbar region: Secondary | ICD-10-CM | POA: Diagnosis not present

## 2021-07-23 DIAGNOSIS — M9903 Segmental and somatic dysfunction of lumbar region: Secondary | ICD-10-CM | POA: Diagnosis not present

## 2021-08-06 DIAGNOSIS — M9903 Segmental and somatic dysfunction of lumbar region: Secondary | ICD-10-CM | POA: Diagnosis not present

## 2021-08-06 DIAGNOSIS — M5136 Other intervertebral disc degeneration, lumbar region: Secondary | ICD-10-CM | POA: Diagnosis not present

## 2021-08-06 DIAGNOSIS — M5441 Lumbago with sciatica, right side: Secondary | ICD-10-CM | POA: Diagnosis not present

## 2021-08-06 DIAGNOSIS — M62830 Muscle spasm of back: Secondary | ICD-10-CM | POA: Diagnosis not present

## 2021-08-13 DIAGNOSIS — M9903 Segmental and somatic dysfunction of lumbar region: Secondary | ICD-10-CM | POA: Diagnosis not present

## 2021-08-13 DIAGNOSIS — M5136 Other intervertebral disc degeneration, lumbar region: Secondary | ICD-10-CM | POA: Diagnosis not present

## 2021-08-13 DIAGNOSIS — M5441 Lumbago with sciatica, right side: Secondary | ICD-10-CM | POA: Diagnosis not present

## 2021-08-13 DIAGNOSIS — M62830 Muscle spasm of back: Secondary | ICD-10-CM | POA: Diagnosis not present

## 2021-08-14 DIAGNOSIS — M6701 Short Achilles tendon (acquired), right ankle: Secondary | ICD-10-CM | POA: Diagnosis not present

## 2021-08-25 DIAGNOSIS — M62830 Muscle spasm of back: Secondary | ICD-10-CM | POA: Diagnosis not present

## 2021-08-25 DIAGNOSIS — M5136 Other intervertebral disc degeneration, lumbar region: Secondary | ICD-10-CM | POA: Diagnosis not present

## 2021-08-25 DIAGNOSIS — M5441 Lumbago with sciatica, right side: Secondary | ICD-10-CM | POA: Diagnosis not present

## 2021-08-25 DIAGNOSIS — M9903 Segmental and somatic dysfunction of lumbar region: Secondary | ICD-10-CM | POA: Diagnosis not present

## 2021-08-31 DIAGNOSIS — Z01812 Encounter for preprocedural laboratory examination: Secondary | ICD-10-CM | POA: Diagnosis not present

## 2021-08-31 DIAGNOSIS — Z7189 Other specified counseling: Secondary | ICD-10-CM | POA: Diagnosis not present

## 2021-08-31 DIAGNOSIS — G8929 Other chronic pain: Secondary | ICD-10-CM | POA: Diagnosis not present

## 2021-08-31 DIAGNOSIS — M79671 Pain in right foot: Secondary | ICD-10-CM | POA: Diagnosis not present

## 2021-09-10 DIAGNOSIS — M9903 Segmental and somatic dysfunction of lumbar region: Secondary | ICD-10-CM | POA: Diagnosis not present

## 2021-09-10 DIAGNOSIS — M62830 Muscle spasm of back: Secondary | ICD-10-CM | POA: Diagnosis not present

## 2021-09-10 DIAGNOSIS — M5441 Lumbago with sciatica, right side: Secondary | ICD-10-CM | POA: Diagnosis not present

## 2021-09-10 DIAGNOSIS — M5136 Other intervertebral disc degeneration, lumbar region: Secondary | ICD-10-CM | POA: Diagnosis not present

## 2021-09-22 DIAGNOSIS — M1712 Unilateral primary osteoarthritis, left knee: Secondary | ICD-10-CM | POA: Diagnosis not present

## 2021-09-29 DIAGNOSIS — G8918 Other acute postprocedural pain: Secondary | ICD-10-CM | POA: Diagnosis not present

## 2021-09-29 DIAGNOSIS — M6701 Short Achilles tendon (acquired), right ankle: Secondary | ICD-10-CM | POA: Diagnosis not present

## 2021-09-29 DIAGNOSIS — M19071 Primary osteoarthritis, right ankle and foot: Secondary | ICD-10-CM | POA: Diagnosis not present

## 2021-10-16 DIAGNOSIS — Z4789 Encounter for other orthopedic aftercare: Secondary | ICD-10-CM | POA: Diagnosis not present

## 2021-10-19 DIAGNOSIS — Z4789 Encounter for other orthopedic aftercare: Secondary | ICD-10-CM | POA: Diagnosis not present

## 2021-11-13 DIAGNOSIS — M19079 Primary osteoarthritis, unspecified ankle and foot: Secondary | ICD-10-CM | POA: Diagnosis not present

## 2021-11-13 DIAGNOSIS — M19071 Primary osteoarthritis, right ankle and foot: Secondary | ICD-10-CM | POA: Diagnosis not present

## 2021-11-13 DIAGNOSIS — R69 Illness, unspecified: Secondary | ICD-10-CM | POA: Diagnosis not present

## 2021-11-19 DIAGNOSIS — M62830 Muscle spasm of back: Secondary | ICD-10-CM | POA: Diagnosis not present

## 2021-11-19 DIAGNOSIS — M5441 Lumbago with sciatica, right side: Secondary | ICD-10-CM | POA: Diagnosis not present

## 2021-11-19 DIAGNOSIS — M5136 Other intervertebral disc degeneration, lumbar region: Secondary | ICD-10-CM | POA: Diagnosis not present

## 2021-11-19 DIAGNOSIS — M9903 Segmental and somatic dysfunction of lumbar region: Secondary | ICD-10-CM | POA: Diagnosis not present

## 2021-12-07 DIAGNOSIS — M19071 Primary osteoarthritis, right ankle and foot: Secondary | ICD-10-CM | POA: Diagnosis not present

## 2021-12-09 DIAGNOSIS — Z1231 Encounter for screening mammogram for malignant neoplasm of breast: Secondary | ICD-10-CM | POA: Diagnosis not present

## 2022-01-05 DIAGNOSIS — Z85828 Personal history of other malignant neoplasm of skin: Secondary | ICD-10-CM | POA: Diagnosis not present

## 2022-01-05 DIAGNOSIS — D225 Melanocytic nevi of trunk: Secondary | ICD-10-CM | POA: Diagnosis not present

## 2022-01-05 DIAGNOSIS — L821 Other seborrheic keratosis: Secondary | ICD-10-CM | POA: Diagnosis not present

## 2022-01-05 DIAGNOSIS — D1801 Hemangioma of skin and subcutaneous tissue: Secondary | ICD-10-CM | POA: Diagnosis not present

## 2022-01-05 DIAGNOSIS — L814 Other melanin hyperpigmentation: Secondary | ICD-10-CM | POA: Diagnosis not present

## 2022-01-05 DIAGNOSIS — L570 Actinic keratosis: Secondary | ICD-10-CM | POA: Diagnosis not present

## 2022-01-08 DIAGNOSIS — M19071 Primary osteoarthritis, right ankle and foot: Secondary | ICD-10-CM | POA: Diagnosis not present

## 2022-01-12 DIAGNOSIS — M5136 Other intervertebral disc degeneration, lumbar region: Secondary | ICD-10-CM | POA: Diagnosis not present

## 2022-01-12 DIAGNOSIS — M62830 Muscle spasm of back: Secondary | ICD-10-CM | POA: Diagnosis not present

## 2022-01-12 DIAGNOSIS — M5441 Lumbago with sciatica, right side: Secondary | ICD-10-CM | POA: Diagnosis not present

## 2022-01-12 DIAGNOSIS — M9903 Segmental and somatic dysfunction of lumbar region: Secondary | ICD-10-CM | POA: Diagnosis not present

## 2022-01-19 DIAGNOSIS — M5441 Lumbago with sciatica, right side: Secondary | ICD-10-CM | POA: Diagnosis not present

## 2022-01-19 DIAGNOSIS — M5136 Other intervertebral disc degeneration, lumbar region: Secondary | ICD-10-CM | POA: Diagnosis not present

## 2022-01-19 DIAGNOSIS — M9903 Segmental and somatic dysfunction of lumbar region: Secondary | ICD-10-CM | POA: Diagnosis not present

## 2022-01-19 DIAGNOSIS — M62830 Muscle spasm of back: Secondary | ICD-10-CM | POA: Diagnosis not present

## 2022-01-21 DIAGNOSIS — M62830 Muscle spasm of back: Secondary | ICD-10-CM | POA: Diagnosis not present

## 2022-01-21 DIAGNOSIS — M5441 Lumbago with sciatica, right side: Secondary | ICD-10-CM | POA: Diagnosis not present

## 2022-01-21 DIAGNOSIS — M5136 Other intervertebral disc degeneration, lumbar region: Secondary | ICD-10-CM | POA: Diagnosis not present

## 2022-01-21 DIAGNOSIS — M9903 Segmental and somatic dysfunction of lumbar region: Secondary | ICD-10-CM | POA: Diagnosis not present

## 2022-01-22 DIAGNOSIS — H52223 Regular astigmatism, bilateral: Secondary | ICD-10-CM | POA: Diagnosis not present

## 2022-02-04 DIAGNOSIS — M5136 Other intervertebral disc degeneration, lumbar region: Secondary | ICD-10-CM | POA: Diagnosis not present

## 2022-02-04 DIAGNOSIS — M5441 Lumbago with sciatica, right side: Secondary | ICD-10-CM | POA: Diagnosis not present

## 2022-02-04 DIAGNOSIS — M62830 Muscle spasm of back: Secondary | ICD-10-CM | POA: Diagnosis not present

## 2022-02-04 DIAGNOSIS — M9903 Segmental and somatic dysfunction of lumbar region: Secondary | ICD-10-CM | POA: Diagnosis not present

## 2022-02-10 DIAGNOSIS — M9903 Segmental and somatic dysfunction of lumbar region: Secondary | ICD-10-CM | POA: Diagnosis not present

## 2022-02-10 DIAGNOSIS — M5441 Lumbago with sciatica, right side: Secondary | ICD-10-CM | POA: Diagnosis not present

## 2022-02-10 DIAGNOSIS — M62830 Muscle spasm of back: Secondary | ICD-10-CM | POA: Diagnosis not present

## 2022-02-10 DIAGNOSIS — M5136 Other intervertebral disc degeneration, lumbar region: Secondary | ICD-10-CM | POA: Diagnosis not present

## 2022-02-19 DIAGNOSIS — M19071 Primary osteoarthritis, right ankle and foot: Secondary | ICD-10-CM | POA: Diagnosis not present

## 2022-03-03 DIAGNOSIS — M5441 Lumbago with sciatica, right side: Secondary | ICD-10-CM | POA: Diagnosis not present

## 2022-03-03 DIAGNOSIS — M5136 Other intervertebral disc degeneration, lumbar region: Secondary | ICD-10-CM | POA: Diagnosis not present

## 2022-03-03 DIAGNOSIS — M62830 Muscle spasm of back: Secondary | ICD-10-CM | POA: Diagnosis not present

## 2022-03-03 DIAGNOSIS — M9903 Segmental and somatic dysfunction of lumbar region: Secondary | ICD-10-CM | POA: Diagnosis not present

## 2022-03-16 DIAGNOSIS — M5136 Other intervertebral disc degeneration, lumbar region: Secondary | ICD-10-CM | POA: Diagnosis not present

## 2022-03-16 DIAGNOSIS — M62830 Muscle spasm of back: Secondary | ICD-10-CM | POA: Diagnosis not present

## 2022-03-16 DIAGNOSIS — M9903 Segmental and somatic dysfunction of lumbar region: Secondary | ICD-10-CM | POA: Diagnosis not present

## 2022-03-16 DIAGNOSIS — M5441 Lumbago with sciatica, right side: Secondary | ICD-10-CM | POA: Diagnosis not present

## 2022-03-23 DIAGNOSIS — Z1389 Encounter for screening for other disorder: Secondary | ICD-10-CM | POA: Diagnosis not present

## 2022-03-23 DIAGNOSIS — Z23 Encounter for immunization: Secondary | ICD-10-CM | POA: Diagnosis not present

## 2022-03-23 DIAGNOSIS — Z6833 Body mass index (BMI) 33.0-33.9, adult: Secondary | ICD-10-CM | POA: Diagnosis not present

## 2022-03-23 DIAGNOSIS — Z0000 Encounter for general adult medical examination without abnormal findings: Secondary | ICD-10-CM | POA: Diagnosis not present

## 2022-03-29 DIAGNOSIS — R69 Illness, unspecified: Secondary | ICD-10-CM | POA: Diagnosis not present

## 2022-03-29 DIAGNOSIS — G894 Chronic pain syndrome: Secondary | ICD-10-CM | POA: Diagnosis not present

## 2022-03-29 DIAGNOSIS — Z6833 Body mass index (BMI) 33.0-33.9, adult: Secondary | ICD-10-CM | POA: Diagnosis not present

## 2022-03-29 DIAGNOSIS — I129 Hypertensive chronic kidney disease with stage 1 through stage 4 chronic kidney disease, or unspecified chronic kidney disease: Secondary | ICD-10-CM | POA: Diagnosis not present

## 2022-03-29 DIAGNOSIS — E7800 Pure hypercholesterolemia, unspecified: Secondary | ICD-10-CM | POA: Diagnosis not present

## 2022-03-29 DIAGNOSIS — N1832 Chronic kidney disease, stage 3b: Secondary | ICD-10-CM | POA: Diagnosis not present

## 2022-05-02 DIAGNOSIS — H66001 Acute suppurative otitis media without spontaneous rupture of ear drum, right ear: Secondary | ICD-10-CM | POA: Diagnosis not present

## 2022-05-02 DIAGNOSIS — H8111 Benign paroxysmal vertigo, right ear: Secondary | ICD-10-CM | POA: Diagnosis not present

## 2022-05-10 DIAGNOSIS — H6992 Unspecified Eustachian tube disorder, left ear: Secondary | ICD-10-CM | POA: Diagnosis not present

## 2022-05-19 DIAGNOSIS — M5136 Other intervertebral disc degeneration, lumbar region: Secondary | ICD-10-CM | POA: Diagnosis not present

## 2022-05-19 DIAGNOSIS — M9903 Segmental and somatic dysfunction of lumbar region: Secondary | ICD-10-CM | POA: Diagnosis not present

## 2022-05-19 DIAGNOSIS — M5441 Lumbago with sciatica, right side: Secondary | ICD-10-CM | POA: Diagnosis not present

## 2022-05-19 DIAGNOSIS — M62830 Muscle spasm of back: Secondary | ICD-10-CM | POA: Diagnosis not present

## 2022-05-21 DIAGNOSIS — H6991 Unspecified Eustachian tube disorder, right ear: Secondary | ICD-10-CM | POA: Diagnosis not present

## 2022-06-21 DIAGNOSIS — H903 Sensorineural hearing loss, bilateral: Secondary | ICD-10-CM | POA: Diagnosis not present

## 2022-06-21 DIAGNOSIS — H6991 Unspecified Eustachian tube disorder, right ear: Secondary | ICD-10-CM | POA: Diagnosis not present

## 2023-12-26 NOTE — Therapy (Signed)
 OUTPATIENT PHYSICAL THERAPY THORACOLUMBAR EVALUATION   Patient Name: Morgan Fitzgerald MRN: [id number] DOB:09/15/1945, 78 y.o., female Today's Date: 12/27/2023  END OF SESSION:  PT End of Session - 12/27/23 1301     Visit Number 1    Date for PT Re-Evaluation 02/24/24    Authorization Type [employer] MCR    Progress Note Due on Visit 10    PT Start Time 1200    PT Stop Time 1240    PT Time Calculation (min) 40 min    Activity Tolerance Patient tolerated treatment well    Behavior During Therapy [facility] for tasks assessed/performed          Past Medical History:  Diagnosis Date   Allergic state 01/14/2011   Bronchitis 03/24/2011   Chicken pox as a child   Hyperlipidemia    Hyperlipidemia 03/09/2012   Hypertension    Measles as a child   Preventative health care 01/14/2011   Tobacco abuse 03/24/2011   Past Surgical History:  Procedure Laterality Date   BREAST SURGERY  1997   breast biopsy, right   carpal tunnel release, b/l  1990's   right foot bunionectomy  2010   4 pins in lateral right foot   right shoulder repai  1966   s/p fracture that healed poorly   TONSILLECTOMY AND ADENOIDECTOMY     total knee replacement, right     TUBAL LIGATION     Patient Active Problem List   Diagnosis Date Noted   Degeneration of lumbar intervertebral disc 12/01/2017   Scoliosis deformity of spine 12/01/2017   Tibialis posterior tendinitis 07/27/2017   Tobacco abuse 03/24/2011   Bronchitis 03/24/2011   Preventative health care 01/14/2011   Allergy 01/14/2011   Chicken pox    Measles    Viral warts 12/10/2009   Mixed hyperlipidemia 12/10/2009   Overweight 12/10/2009   ANEMIA 12/10/2009   ESSENTIAL HYPERTENSION, BENIGN 12/10/2009   RENAL INSUFFICIENCY, CHRONIC 12/10/2009   KNEE PAIN 12/10/2009   NECK PAIN, CHRONIC 12/10/2009   Spina bifida with hydrocephalus (HCC) 12/10/2009    PCP: Cleotilde Planas, MD   REFERRING PROVIDER: Gretel Piedra, NP  REFERRING DIAG:  Diagnosis  M51.360  (ICD-10-CM) - Other intervertebral disc degeneration, lumbar region with discogenic back pain only    Rationale for Evaluation and Treatment: Rehabilitation  THERAPY DIAG:  Other low back pain  Abnormal posture  Muscle weakness (generalized)  Other abnormalities of gait and mobility  ONSET DATE: Chronic  SUBJECTIVE:  SUBJECTIVE STATEMENT: Patient presents with chronic back pain that has been getting worst. She reports needing 9 discs that needs to be replaced. Her scoliosis has been getting worst as she gets older. Patient reports numbness numbness that goes down her right foot and an achy pain in her right thigh. She needs to her her left knee replace. Limiting functional activities: shopping, walking the dog, going up and down stairs, yard work. She is scheduled to get a nerve block next week.  PERTINENT HISTORY:  Hx right TKA; HTN; Scoliosis; Hx right ankle fusion  PAIN:  Are you having pain? Yes: NPRS scale: 5(currently) 9(worst)/10 Pain location: Lower back both sides Pain description: constant dull; achy Aggravating factors: getting out of bed; walking & standing; bending Relieving factors: resting (not doing any yard or cleaning)  PRECAUTIONS: None  RED FLAGS: None   WEIGHT BEARING RESTRICTIONS: No  FALLS:  Has patient fallen in last 6 months? 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  DIAGNOSTIC FINDINGS:  02/2018 Lumbar X ray IMPRESSION: 1. Dextroconvex lumbar scoliosis with diffuse advanced lumbar disc and endplate degeneration. Widespread posterior element hypertrophy. Lower thoracic disc degeneration. 2. Degenerative edema in the L1 and L2 vertebral bodies. No other No acute osseous abnormality identified. 3. Moderate to severe multifactorial spinal stenosis at L1-L2 which is the level of the conus medullaris. No conus or lower thoracic spinal cord signal abnormality. 4. Mild spinal stenosis at L1-L2 and T11-T12. 5. Moderate to severe neural foraminal stenosis at the left T11, left L2, right L3, right L4, and bilateral L5 nerve levels.  PATIENT SURVEYS:  ODI: 17/50 34%  Minimally Clinically Important Difference (MCID) = 12.8%  COGNITION: Overall cognitive status: Within functional limits for tasks assessed     SENSATION: Numbness in right leg that goes down to her feet    POSTURE: rounded shoulders, forward head, and Lateral trunk lean to the right   PALPATION: Lumbar: Concave to the right ;convex to the left lumbar Left pelvis elevated  LUMBAR ROM: Not assessed at eval  AROM eval  Flexion   Extension   Right lateral flexion   Left lateral flexion   Right rotation   Left rotation    (Blank rows = not tested)  LOWER EXTREMITY ROM:   WFL bilateral    LOWER EXTREMITY MMT:    MMT Right eval Left eval  Hip flexion 4- 4-  Hip extension    Hip abduction 4 4-  Hip adduction    Hip internal rotation    Hip external rotation    Knee flexion 4+ 4+  Knee extension 4+ 4-  Ankle  dorsiflexion    Ankle plantarflexion    Ankle inversion    Ankle eversion     (Blank rows = not tested)   FUNCTIONAL  TESTS:  5 times sit to stand: 9.69 sec no UE support ; uncontrolled descent Timed up and go (TUG): 13.44 sec  GAIT:  Comments: right lateral trunk lean, Wide BOS   TREATMENT DATE:  12/27/2023 Initial Evaluation & HEP created                                                                                                                                 PATIENT EDUCATION:  Education details: PT eval findings, anticipated POC, progress with PT, and initial HEP Person educated: Patient Education method: Explanation, Demonstration, and Handouts Education comprehension: verbalized understanding, returned demonstration, and needs further education  HOME EXERCISE PROGRAM: Access Code: [email or web address] URL: [email or web address] Date: 12/27/2023 Prepared by: Kristeen Sar  Exercises - Sidelying Diaphragmatic Breathing  - 1 x daily - 7 x weekly - 1 sets - 5 reps - 3 hold - Seated Alternating Side Stretch with Arm Overhead  - 1 x daily - 7 x weekly - 3 sets - 10s hold - Supine Lower Trunk Rotation  - 1 x daily - 7 x weekly - 1 sets - 8 reps - 4s hold - Seated Transversus Abdominis Bracing  - 1 x daily - 7 x weekly - 1-2 sets - 10 reps  ASSESSMENT:  CLINICAL IMPRESSION: Patient is a 78 y.o. female who was seen today for physical therapy evaluation and treatment for low back pain. Morgan Fitzgerald presents to skilled therapy with chronic low back pain that has been getting worst due to her scoliosis. Patient's resting standing posture is a lateral trunk lean to the right with her pelvic shifted to the left. Her scoliosis is more prominent in her lumbar spine with the concave curve to the right. Educated patient on the goals of physical therapy in patient's with scoliosis. Patient responded well to stretching on the left sie body. Patient is motivated and wants to maintain functional  independence and mobility. Patient will benefit from skilled PT to address the below impairments and improve overall function.    OBJECTIVE IMPAIRMENTS: Abnormal gait, decreased activity tolerance, decreased balance, decreased mobility, difficulty walking, decreased ROM, decreased strength, hypomobility, increased muscle spasms, impaired flexibility, impaired sensation, improper body mechanics, postural dysfunction, and pain.   ACTIVITY LIMITATIONS: lifting, bending, standing, squatting, sleeping, stairs, transfers, bed mobility, continence, toileting, dressing, hygiene/grooming, and locomotion level  PARTICIPATION LIMITATIONS: meal prep, cleaning, laundry, interpersonal relationship, shopping, community activity, occupation, and yard work  PERSONAL FACTORS: Age, Fitness, Time since onset of injury/illness/exacerbation, and 1-2 comorbidities: HTN; Scoliosis are also affecting patient's functional outcome.   REHAB POTENTIAL: Good  CLINICAL DECISION MAKING: Evolving/moderate complexity  EVALUATION COMPLEXITY: Moderate   GOALS: Goals reviewed with patient? Yes  SHORT TERM GOALS: Target date: 01/24/2024  Patient will be independent with initial HEP. Baseline:  Goal status: INITIAL  2.  Patient will be able participate in the 3 or 6 MWT to establish a baseline for the test. Baseline:  Goal status: INITIAL  3.  Patient will demonstrate correct hinging technique to prevent mechanical strain and pain during lifting and bending activities. Baseline:  Goal status: INITIAL    LONG TERM GOALS: Target date: 02/21/2024  Patient will demonstrate independence in advanced HEP. Baseline:  Goal status: INITIAL  2.  Patient will report > or = to 30% improvement in functional mobility since starting PT. Baseline:  Goal status: INITIAL  3.  Patient will verbalize and demonstrate self-care strategies to manage pain including tissue mobility practices and change of position. Baseline:  Goal  status: INITIAL  4.  Patient will score < or = to 12 sec on TUG to decrease falls risk. Baseline: 13.44 sec Goal status: INITIAL  5.  Patient will be able to walk for at least 5 mins to improve functional mobility and community ambulation. Baseline:  Goal status: INITIAL    PLAN:  PT FREQUENCY: 1-2x/week  PT DURATION: 8 weeks  PLANNED INTERVENTIONS: [id number]- PT Re-evaluation, 97110-Therapeutic exercises, 97530- Therapeutic activity, 97112- Neuromuscular re-education, 97535- Self Care, [id number]- Manual therapy, [phone]- Gait training, [phone]- Canalith repositioning, [id number]- Aquatic Therapy, [phone]- Electrical stimulation (unattended), [phone]- Electrical stimulation (manual), [id number]- Vasopneumatic device, [id number]- Ultrasound, [id number]- Traction (mechanical), [id number]- Ionotophoresis 4mg /ml Dexamethasone , [id number] (1-2 muscles), [id number] (3+ muscles)- Dry Needling, Patient/Family education, Balance training, Stair training, Taping, Joint mobilization, Joint manipulation, Spinal manipulation, Spinal mobilization, Scar mobilization, Compression bandaging, Vestibular training, Cryotherapy, and Moist heat.  PLAN FOR NEXT SESSION: Review HEP; NuStep; strengthening right side body; stretching left side body; seated clams; LAQ on Reta Kristeen Sar, PT 12/27/23 1:02 PM [facility] [address] [place], [place] [address] Phone # [phone] Fax [phone]

## 2023-12-27 ENCOUNTER — Ambulatory Visit: Attending: Nurse Practitioner | Admitting: Physical Therapy

## 2023-12-27 ENCOUNTER — Encounter: Payer: Self-pay | Admitting: Physical Therapy

## 2023-12-27 ENCOUNTER — Other Ambulatory Visit: Payer: Self-pay

## 2023-12-27 DIAGNOSIS — R2689 Other abnormalities of gait and mobility: Secondary | ICD-10-CM | POA: Insufficient documentation

## 2023-12-27 DIAGNOSIS — R293 Abnormal posture: Secondary | ICD-10-CM | POA: Diagnosis present

## 2023-12-27 DIAGNOSIS — M5459 Other low back pain: Secondary | ICD-10-CM | POA: Diagnosis present

## 2023-12-27 DIAGNOSIS — M6281 Muscle weakness (generalized): Secondary | ICD-10-CM | POA: Insufficient documentation

## 2024-01-04 ENCOUNTER — Encounter: Payer: Self-pay | Admitting: Rehabilitative and Restorative Service Providers"

## 2024-01-04 ENCOUNTER — Ambulatory Visit: Admitting: Rehabilitative and Restorative Service Providers"

## 2024-01-04 DIAGNOSIS — R2689 Other abnormalities of gait and mobility: Secondary | ICD-10-CM

## 2024-01-04 DIAGNOSIS — M5459 Other low back pain: Secondary | ICD-10-CM | POA: Diagnosis not present

## 2024-01-04 DIAGNOSIS — M6281 Muscle weakness (generalized): Secondary | ICD-10-CM

## 2024-01-04 DIAGNOSIS — R293 Abnormal posture: Secondary | ICD-10-CM

## 2024-01-04 NOTE — Therapy (Signed)
 OUTPATIENT PHYSICAL THERAPY TREATMENT NOTE   Patient Name: Morgan Fitzgerald MRN: [id number] DOB:09/02/1945, 78 y.o., female Today's Date: 01/04/2024  END OF SESSION:  PT End of Session - 01/04/24 1148     Visit Number 2    Date for PT Re-Evaluation 02/24/24    Authorization Type BCBS MCR    Progress Note Due on Visit 10    PT Start Time 1147    PT Stop Time 1310    PT Time Calculation (min) 83 min    Activity Tolerance Patient tolerated treatment well    Behavior During Therapy WFL for tasks assessed/performed          Past Medical History:  Diagnosis Date   Allergic state 01/14/2011   Bronchitis 03/24/2011   Chicken pox as a child   Hyperlipidemia    Hyperlipidemia 03/09/2012   Hypertension    Measles as a child   Preventative health care 01/14/2011   Tobacco abuse 03/24/2011   Past Surgical History:  Procedure Laterality Date   BREAST SURGERY  1997   breast biopsy, right   carpal tunnel release, b/l  1990's   right foot bunionectomy  2010   4 pins in lateral right foot   right shoulder repai  1966   s/p fracture that healed poorly   TONSILLECTOMY AND ADENOIDECTOMY     total knee replacement, right     TUBAL LIGATION     Patient Active Problem List   Diagnosis Date Noted   Degeneration of lumbar intervertebral disc 12/01/2017   Scoliosis deformity of spine 12/01/2017   Tibialis posterior tendinitis 07/27/2017   Tobacco abuse 03/24/2011   Bronchitis 03/24/2011   Preventative health care 01/14/2011   Allergy 01/14/2011   Chicken pox    Measles    Viral warts 12/10/2009   Mixed hyperlipidemia 12/10/2009   Overweight 12/10/2009   ANEMIA 12/10/2009   ESSENTIAL HYPERTENSION, BENIGN 12/10/2009   RENAL INSUFFICIENCY, CHRONIC 12/10/2009   KNEE PAIN 12/10/2009   NECK PAIN, CHRONIC 12/10/2009   Spina bifida with hydrocephalus (HCC) 12/10/2009    PCP: Cleotilde Planas, MD   REFERRING PROVIDER: Gretel Piedra, NP  REFERRING DIAG:  Diagnosis  M51.360  (ICD-10-CM) - Other intervertebral disc degeneration, lumbar region with discogenic back pain only    Rationale for Evaluation and Treatment: Rehabilitation  THERAPY DIAG:  Other low back pain  Abnormal posture  Muscle weakness (generalized)  Other abnormalities of gait and mobility  ONSET DATE: Chronic  SUBJECTIVE:  SUBJECTIVE STATEMENT: Patient reports having increased pain today.  PERTINENT HISTORY:  Hx right TKA; HTN; Scoliosis; Hx right ankle fusion  PAIN:  Are you having pain? Yes: NPRS scale: 7/10 Pain location: Lower back both sides Pain description: constant dull; achy Aggravating factors: getting out of bed; walking & standing; bending Relieving factors: resting (not doing any yard or cleaning)  PRECAUTIONS: None  RED FLAGS: None   WEIGHT BEARING RESTRICTIONS: No  FALLS:  Has patient fallen in last 6 months? 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  DIAGNOSTIC FINDINGS:  02/2018 Lumbar X ray IMPRESSION: 1. Dextroconvex  lumbar scoliosis with diffuse advanced lumbar disc and endplate degeneration. Widespread posterior element hypertrophy. Lower thoracic disc degeneration. 2. Degenerative edema in the L1 and L2 vertebral bodies. No other No acute osseous abnormality identified. 3. Moderate to severe multifactorial spinal stenosis at L1-L2 which is the level of the conus medullaris. No conus or lower thoracic spinal cord signal abnormality. 4. Mild spinal stenosis at L1-L2 and T11-T12. 5. Moderate to severe neural foraminal stenosis at the left T11, left L2, right L3, right L4, and bilateral L5 nerve levels.  PATIENT SURVEYS:  ODI: 17/50 34%  Minimally Clinically Important Difference (MCID) = 12.8%  COGNITION: Overall cognitive status: Within functional limits for tasks assessed     SENSATION: Numbness in right leg that goes down to her feet    POSTURE: rounded shoulders, forward head, and Lateral trunk lean to the right   PALPATION: Lumbar: Concave to the right ;convex to the left lumbar Left pelvis elevated  LUMBAR ROM: Not assessed at eval  AROM eval  Flexion   Extension   Right lateral flexion   Left lateral flexion   Right rotation   Left rotation    (Blank rows = not tested)  LOWER EXTREMITY ROM:   WFL bilateral    LOWER EXTREMITY MMT:    MMT Right eval Left eval  Hip flexion 4- 4-  Hip extension    Hip abduction 4 4-  Hip adduction    Hip internal rotation    Hip external rotation    Knee flexion 4+ 4+  Knee extension 4+ 4-  Ankle dorsiflexion    Ankle plantarflexion    Ankle inversion    Ankle eversion     (Blank rows = not tested)   FUNCTIONAL TESTS:  Eval: 5 times sit to stand: 9.69 sec no UE support ; uncontrolled descent Timed up and go (TUG): 13.44 sec  01/04/2024: 3 minute walk test:  285 ft with pain increasing to 8/10, RPE of 8/10  GAIT:  Comments: right lateral trunk lean, Wide BOS   TREATMENT DATE:  01/04/2024: Seated transversus abdominus  contraction by pressing ball into thighs 2x10 Seated hip adduction ball squeeze 2x10 Seated 3 way green  pball rollout x10 each direction Seated hamstring stretch 2x20 sec bilat 3 minute walk test:  285 ft with pain increasing to 8/10, RPE of 8/10 Seated long arc quad 2x10 bilat Seated heel/toe raises x20 Nustep level 3 x6 min with PT present to discuss status Seated scapular retraction x10 (with 5 sec hold)   12/27/2023  Initial Evaluation & HEP created                                                                                                                                 PATIENT EDUCATION:  Education details: PT eval findings, anticipated POC, progress with PT, and initial HEP Person educated: Patient Education method: Explanation, Demonstration, and Handouts Education comprehension: verbalized understanding, returned demonstration, and needs further education  HOME EXERCISE PROGRAM: Access Code: [email or web address] URL: [email or web address] Date: 01/04/2024 Prepared by: Jarrell Mica Ramdass  Exercises - Sidelying Diaphragmatic Breathing  - 1 x daily - 7 x weekly - 1 sets - 5 reps - 3 hold - Seated Alternating Side Stretch with Arm Overhead  - 1 x daily - 7 x weekly - 3 sets - 10s hold - Supine Lower Trunk Rotation  - 1 x daily - 7 x weekly - 1 sets - 8 reps - 4s hold - Seated Transversus Abdominis Bracing  - 1 x daily - 7 x weekly - 1-2 sets - 10 reps - Seated Hamstring Stretch  - 1 x daily - 7 x weekly - 2 reps - 20 sec hold - Seated Scapular Retraction  - 1 x daily - 7 x weekly - 2 sets - 10 reps  ASSESSMENT:  CLINICAL IMPRESSION: Ms Lusty presents to skilled PT reporting that she has been doing her exercises and they do seem to be helping.  Patient able to complete 3 minute walk test today, but did have increased pain and increased dyspnea as a result.  Patient did report that the stretches felt good, so added those to the HEP.  Patient continues to require skilled PT to  progress towards goal related activities.    OBJECTIVE IMPAIRMENTS: Abnormal gait, decreased activity tolerance, decreased balance, decreased mobility, difficulty walking, decreased ROM, decreased strength, hypomobility, increased muscle spasms, impaired flexibility, impaired sensation, improper body mechanics, postural dysfunction, and pain.   ACTIVITY LIMITATIONS: lifting, bending, standing, squatting, sleeping, stairs, transfers, bed mobility, continence, toileting, dressing, hygiene/grooming, and locomotion level  PARTICIPATION LIMITATIONS: meal prep, cleaning, laundry, interpersonal relationship, shopping, community activity, occupation, and yard work  PERSONAL FACTORS: Age, Fitness, Time since onset of injury/illness/exacerbation, and 1-2 comorbidities: HTN; Scoliosis are also affecting patient's functional outcome.   REHAB POTENTIAL: Good  CLINICAL DECISION MAKING: Evolving/moderate complexity  EVALUATION COMPLEXITY: Moderate   GOALS: Goals reviewed with patient? Yes  SHORT TERM GOALS: Target date: 01/24/2024  Patient will be independent with initial HEP. Baseline:  Goal status: Ongoing  2.  Patient will be able participate in the 3 or 6 MWT to establish a baseline  for the test. Baseline:  Goal status: Met on 01/04/2024 (see above)  3.  Patient will demonstrate correct hinging technique to prevent mechanical strain and pain during lifting and bending activities. Baseline:  Goal status: INITIAL    LONG TERM GOALS: Target date: 02/21/2024  Patient will demonstrate independence in advanced HEP. Baseline:  Goal status: INITIAL  2.  Patient will report > or = to 30% improvement in functional mobility since starting PT. Baseline:  Goal status: INITIAL  3.  Patient will verbalize and demonstrate self-care strategies to manage pain including tissue mobility practices and change of position. Baseline:  Goal status: INITIAL  4.  Patient will score < or = to 12 sec on TUG  to decrease falls risk. Baseline: 13.44 sec Goal status: INITIAL  5.  Patient will be able to walk for at least 5 mins to improve functional mobility and community ambulation. Baseline:  Goal status: INITIAL    PLAN:  PT FREQUENCY: 1-2x/week  PT DURATION: 8 weeks  PLANNED INTERVENTIONS: 97164- PT Re-evaluation, 97110-Therapeutic exercises, 97530- Therapeutic activity, 97112- Neuromuscular re-education, 97535- Self Care, 02859- Manual therapy, [phone]- Gait training, [phone]- Canalith repositioning, [id number]- Aquatic Therapy, [phone]- Electrical stimulation (unattended), [phone]- Electrical stimulation (manual), [id number]- Vasopneumatic device, [id number]- Ultrasound, [id number]- Traction (mechanical), [id number]- Ionotophoresis 4mg /ml Dexamethasone , 79439 (1-2 muscles), [id number] (3+ muscles)- Dry Needling, Patient/Family education, Balance training, Stair training, Taping, Joint mobilization, Joint manipulation, Spinal manipulation, Spinal mobilization, Scar mobilization, Compression bandaging, Vestibular training, Cryotherapy, and Moist heat.  PLAN FOR NEXT SESSION: Assess and progress HEP as indicated, strengthening, flexibility, manual/dry needling as indicated    Jarrell Laming, PT, DPT 01/04/24, 12:33 PM  [facility] [address] [place], [place] [address] Phone # [phone] Fax [phone]

## 2024-01-10 ENCOUNTER — Ambulatory Visit: Admitting: Physical Therapy

## 2024-01-17 ENCOUNTER — Encounter: Admitting: Rehabilitative and Restorative Service Providers"

## 2024-01-27 ENCOUNTER — Encounter: Payer: Self-pay | Admitting: Physical Therapy

## 2024-01-27 ENCOUNTER — Ambulatory Visit: Attending: Nurse Practitioner | Admitting: Physical Therapy

## 2024-01-27 DIAGNOSIS — R293 Abnormal posture: Secondary | ICD-10-CM | POA: Diagnosis present

## 2024-01-27 DIAGNOSIS — R2689 Other abnormalities of gait and mobility: Secondary | ICD-10-CM | POA: Insufficient documentation

## 2024-01-27 DIAGNOSIS — M6281 Muscle weakness (generalized): Secondary | ICD-10-CM | POA: Diagnosis present

## 2024-01-27 DIAGNOSIS — M5459 Other low back pain: Secondary | ICD-10-CM | POA: Insufficient documentation

## 2024-01-27 NOTE — Therapy (Signed)
 OUTPATIENT PHYSICAL THERAPY TREATMENT NOTE   Patient Name: Morgan Fitzgerald MRN: [id number] DOB:03/05/1946, 78 y.o., female Today's Date: 01/27/2024  END OF SESSION:  PT End of Session - 01/27/24 1154     Visit Number 3    Date for Recertification  02/24/24    Authorization Type BCBS MCR    Progress Note Due on Visit 10    PT Start Time 1103    PT Stop Time 1147    PT Time Calculation (min) 44 min    Activity Tolerance Patient tolerated treatment well    Behavior During Therapy [facility] for tasks assessed/performed           Past Medical History:  Diagnosis Date   Allergic state 01/14/2011   Bronchitis 03/24/2011   Chicken pox as a child   Hyperlipidemia    Hyperlipidemia 03/09/2012   Hypertension    Measles as a child   Preventative health care 01/14/2011   Tobacco abuse 03/24/2011   Past Surgical History:  Procedure Laterality Date   BREAST SURGERY  1997   breast biopsy, right   carpal tunnel release, b/l  1990's   right foot bunionectomy  2010   4 pins in lateral right foot   right shoulder repai  1966   s/p fracture that healed poorly   TONSILLECTOMY AND ADENOIDECTOMY     total knee replacement, right     TUBAL LIGATION     Patient Active Problem List   Diagnosis Date Noted   Degeneration of lumbar intervertebral disc 12/01/2017   Scoliosis deformity of spine 12/01/2017   Tibialis posterior tendinitis 07/27/2017   Tobacco abuse 03/24/2011   Bronchitis 03/24/2011   Preventative health care 01/14/2011   Allergy 01/14/2011   Chicken pox    Measles    Viral warts 12/10/2009   Mixed hyperlipidemia 12/10/2009   Overweight 12/10/2009   ANEMIA 12/10/2009   ESSENTIAL HYPERTENSION, BENIGN 12/10/2009   RENAL INSUFFICIENCY, CHRONIC 12/10/2009   KNEE PAIN 12/10/2009   NECK PAIN, CHRONIC 12/10/2009   Spina bifida with hydrocephalus (HCC) 12/10/2009    PCP: Cleotilde Planas, MD   REFERRING PROVIDER: Gretel Piedra, NP  REFERRING DIAG:  Diagnosis  M51.360  (ICD-10-CM) - Other intervertebral disc degeneration, lumbar region with discogenic back pain only    Rationale for Evaluation and Treatment: Rehabilitation  THERAPY DIAG:  Other low back pain  Abnormal posture  Muscle weakness (generalized)  Other abnormalities of gait and mobility  ONSET DATE: Chronic  SUBJECTIVE:  SUBJECTIVE STATEMENT: Patient presents with increased right sided neck and shoulder pain today. She thinks she slept wrong. Pain is 7/10.  PERTINENT HISTORY:  Hx right TKA; HTN; Scoliosis; Hx right ankle fusion  PAIN:  Are you having pain? Yes: NPRS scale: 7/10 Pain location: Lower back both sides Pain description: constant dull; achy Aggravating factors: getting out of bed; walking & standing; bending Relieving factors: resting (not doing any yard or cleaning)  PRECAUTIONS: None  RED FLAGS: None   WEIGHT BEARING RESTRICTIONS: No  FALLS:  Has patient fallen in last 6 months? 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  DIAGNOSTIC FINDINGS:  02/2018 Lumbar X ray IMPRESSION: 1. Dextroconvex lumbar scoliosis with diffuse advanced lumbar disc and endplate degeneration. Widespread posterior element hypertrophy. Lower thoracic disc degeneration. 2. Degenerative edema in the L1 and L2 vertebral bodies. No other No acute osseous abnormality identified. 3. Moderate to severe multifactorial spinal stenosis at L1-L2 which is the level of the conus medullaris. No conus or lower thoracic spinal cord signal abnormality. 4. Mild spinal stenosis at L1-L2 and T11-T12. 5. Moderate to severe neural foraminal stenosis at the left T11, left L2, right L3, right L4, and bilateral L5 nerve levels.  PATIENT SURVEYS:  ODI: 17/50 34%  Minimally Clinically Important Difference (MCID) = 12.8%  COGNITION: Overall cognitive status: Within functional limits for tasks assessed     SENSATION: Numbness in right leg that goes down to her feet    POSTURE: rounded shoulders, forward head, and Lateral trunk lean to the right   PALPATION: Lumbar: Concave to the right ;convex to the left lumbar Left pelvis elevated  LUMBAR ROM: Not assessed at eval  AROM eval  Flexion   Extension   Right lateral flexion   Left lateral flexion   Right rotation   Left rotation    (Blank rows = not tested)  LOWER EXTREMITY ROM:   WFL bilateral    LOWER EXTREMITY MMT:    MMT Right eval Left eval  Hip flexion 4- 4-  Hip extension    Hip abduction 4 4-  Hip adduction    Hip internal rotation    Hip external rotation    Knee flexion 4+ 4+  Knee extension 4+ 4-  Ankle dorsiflexion    Ankle plantarflexion    Ankle inversion    Ankle eversion     (Blank rows = not tested)   FUNCTIONAL TESTS:  Eval: 5 times sit to stand: 9.69 sec no UE support ; uncontrolled descent Timed up and go (TUG): 13.44 sec  01/04/2024: 3 minute walk test:  285 ft with pain increasing to 8/10, RPE of 8/10  GAIT:  Comments: right lateral  trunk lean, Wide BOS   TREATMENT DATE:  01/27/2024: NuStep level 3 6 mins - PT  present to discuss status Seated 3 way green pball rollout x10 each direction Seated cervical rotation x 8 5 sec hold Seated scap retraction x 12 Shoulder circles x 10 (forwards only; back wards was painful) Manual STM to right upper trap and periscapular muscles then educated patient in self assisted manual techniques with lacrosse ball Standing open book x 8 each side Seated transversus abdominus contraction by pressing ball into thighs 2x10 Seated alt hand and knee press with small ball x 10 bilateral  Standing lateral trunk flexion to the left holding 4 # DB x 12 Sitting semi reclines hip to hip with 4# DB x 10 each side Seated hip adduction ball squeeze + TA activation 2x10 Seated shoulder ER with yellow TB 2 x 10    01/04/2024: Seated transversus abdominus contraction by pressing ball into thighs 2x10 Seated hip adduction ball squeeze 2x10 Seated 3 way green pball rollout x10 each direction Seated hamstring stretch 2x20 sec bilat 3 minute walk test:  285 ft with pain increasing to 8/10, RPE of 8/10 Seated long arc quad 2x10 bilat Seated heel/toe raises x20 Nustep level 3 x6 min with PT present to discuss status Seated scapular retraction x10 (with 5 sec hold)   12/27/2023  Initial Evaluation & HEP created                                                                                                                                 PATIENT EDUCATION:  Education details: PT eval findings, anticipated POC, progress with PT, and initial HEP Person educated: Patient Education method: Explanation, Demonstration, and Handouts Education comprehension: verbalized understanding, returned demonstration, and needs further education  HOME EXERCISE PROGRAM: Access Code: [email or web address] URL: [email or web address] Date: 01/27/2024 Prepared by: Kristeen Sar  Exercises - Sidelying Diaphragmatic  Breathing  - 1 x daily - 7 x weekly - 1 sets - 5 reps - 3 hold - Seated Alternating Side Stretch with Arm Overhead  - 1 x daily - 7 x weekly - 3 sets - 10s hold - Supine Lower Trunk Rotation  - 1 x daily - 7 x weekly - 1 sets - 8 reps - 4s hold - Seated Transversus Abdominis Bracing  - 1 x daily - 7 x weekly - 1-2 sets - 10 reps - Seated Hamstring Stretch  - 1 x daily - 7 x weekly - 2 reps - 20 sec hold - Seated Scapular Retraction  - 1 x daily - 7 x weekly - 2 sets - 10 reps - Shoulder External Rotation with Resistance  - 1 x daily - 7 x weekly - 2 sets - 10 reps  ASSESSMENT:  CLINICAL IMPRESSION: Morgan Fitzgerald presents with increased cervical and right shoulder pain that she attributes to sleeping wrong last night. She verbalized compliance with HEP and no challenges with performing exercises. Patient responded well to manual techniques targeting right scapula. PT demonstrated self manual techniques using a lacrosse ball to target  muscle spasms. Updated HEP to include exercise progressions. Patient will benefit from skilled PT to address the below impairments and improve overall function.     OBJECTIVE IMPAIRMENTS: Abnormal gait, decreased activity tolerance, decreased balance, decreased mobility, difficulty walking, decreased ROM, decreased strength, hypomobility, increased muscle spasms, impaired flexibility, impaired sensation, improper body mechanics, postural dysfunction, and pain.   ACTIVITY LIMITATIONS: lifting, bending, standing, squatting, sleeping, stairs, transfers, bed mobility, continence, toileting, dressing, hygiene/grooming, and locomotion level  PARTICIPATION LIMITATIONS: meal prep, cleaning, laundry, interpersonal relationship, shopping, community activity, occupation, and yard work  PERSONAL FACTORS: Age, Fitness, Time since onset of injury/illness/exacerbation, and 1-2 comorbidities: HTN; Scoliosis are also affecting patient's functional outcome.   REHAB POTENTIAL:  Good  CLINICAL DECISION MAKING: Evolving/moderate complexity  EVALUATION COMPLEXITY: Moderate   GOALS: Goals reviewed with patient? Yes  SHORT TERM GOALS: Target date: 01/24/2024  Patient will be independent with initial HEP. Baseline:  Goal status: Ongoing  2.  Patient will be able participate in the 3 or 6 MWT to establish a baseline for the test. Baseline:  Goal status: Met on 01/04/2024 (see above)  3.  Patient will demonstrate correct hinging technique to prevent mechanical strain and pain during lifting and bending activities. Baseline:  Goal status: INITIAL    LONG TERM GOALS: Target date: 02/21/2024  Patient will demonstrate independence in advanced HEP. Baseline:  Goal status: INITIAL  2.  Patient will report > or = to 30% improvement in functional mobility since starting PT. Baseline:  Goal status: INITIAL  3.  Patient will verbalize and demonstrate self-care strategies to manage pain including tissue mobility practices and change of position. Baseline:  Goal status: INITIAL  4.  Patient will score < or = to 12 sec on TUG to decrease falls risk. Baseline: 13.44 sec Goal status: INITIAL  5.  Patient will be able to walk for at least 5 mins to improve functional mobility and community ambulation. Baseline:  Goal status: INITIAL    PLAN:  PT FREQUENCY: 1-2x/week  PT DURATION: 8 weeks  PLANNED INTERVENTIONS: 97164- PT Re-evaluation, 97110-Therapeutic exercises, 97530- Therapeutic activity, [id number]- Neuromuscular re-education, 97535- Self Care, 02859- Manual therapy, [id number]- Gait training, [phone]- Canalith repositioning, [id number]- Aquatic Therapy, [phone]- Electrical stimulation (unattended), [phone]- Electrical stimulation (manual), [id number]- Vasopneumatic device, [id number]- Ultrasound, [id number]- Traction (mechanical), [id number]- Ionotophoresis 4mg /ml Dexamethasone , 79439 (1-2 muscles), 20561 (3+ muscles)- Dry Needling, Patient/Family education, Balance training, Stair training,  Taping, Joint mobilization, Joint manipulation, Spinal manipulation, Spinal mobilization, Scar mobilization, Compression bandaging, Vestibular training, Cryotherapy, and Moist heat.  PLAN FOR NEXT SESSION: assess response to treatment session; strengthening, flexibility, manual/dry needling as indicated     Kristeen Sar, PT 01/27/24 11:55 AM [facility] [address], [address] [place], [place] [address] Phone # [phone] Fax [phone]

## 2024-01-31 ENCOUNTER — Encounter (HOSPITAL_COMMUNITY): Payer: Self-pay | Admitting: Emergency Medicine

## 2024-01-31 ENCOUNTER — Emergency Department (HOSPITAL_COMMUNITY)

## 2024-01-31 ENCOUNTER — Ambulatory Visit: Admitting: Physical Therapy

## 2024-01-31 ENCOUNTER — Emergency Department (HOSPITAL_COMMUNITY)
Admission: EM | Admit: 2024-01-31 | Discharge: 2024-02-01 | Disposition: A | Discharge status: Home / Self Care | Attending: Emergency Medicine | Admitting: Emergency Medicine

## 2024-01-31 ENCOUNTER — Other Ambulatory Visit: Payer: Self-pay

## 2024-01-31 DIAGNOSIS — Z79899 Other long term (current) drug therapy: Secondary | ICD-10-CM | POA: Diagnosis not present

## 2024-01-31 DIAGNOSIS — R0602 Shortness of breath: Secondary | ICD-10-CM | POA: Insufficient documentation

## 2024-01-31 DIAGNOSIS — R079 Chest pain, unspecified: Secondary | ICD-10-CM | POA: Diagnosis not present

## 2024-01-31 DIAGNOSIS — I7090 Unspecified atherosclerosis: Secondary | ICD-10-CM | POA: Insufficient documentation

## 2024-01-31 DIAGNOSIS — M25511 Pain in right shoulder: Secondary | ICD-10-CM | POA: Diagnosis present

## 2024-01-31 DIAGNOSIS — I10 Essential (primary) hypertension: Secondary | ICD-10-CM | POA: Diagnosis not present

## 2024-01-31 DIAGNOSIS — Z7982 Long term (current) use of aspirin: Secondary | ICD-10-CM | POA: Insufficient documentation

## 2024-01-31 LAB — CBC
HCT: 32.8 % — ABNORMAL LOW (ref 36.0–46.0)
Hemoglobin: 10.7 g/dL — ABNORMAL LOW (ref 12.0–15.0)
MCH: 29.8 pg (ref 26.0–34.0)
MCHC: 32.6 g/dL (ref 30.0–36.0)
MCV: 91.4 fL (ref 80.0–100.0)
Platelets: 234 K/uL (ref 150–400)
RBC: 3.59 MIL/uL — ABNORMAL LOW (ref 3.87–5.11)
RDW: 12.5 % (ref 11.5–15.5)
WBC: 8.6 K/uL (ref 4.0–10.5)
nRBC: 0 % (ref 0.0–0.2)

## 2024-01-31 LAB — TROPONIN I (HIGH SENSITIVITY)
Troponin I (High Sensitivity): 9 ng/L (ref <18)
Troponin I (High Sensitivity): 8 ng/L (ref <18)

## 2024-01-31 LAB — BASIC METABOLIC PANEL WITH GFR
Anion gap: 10 (ref 5–15)
BUN: 25 mg/dL — ABNORMAL HIGH (ref 8–23)
CO2: 21 mmol/L — ABNORMAL LOW (ref 22–32)
Calcium: 9.5 mg/dL (ref 8.9–10.3)
Chloride: 106 mmol/L (ref 98–111)
Creatinine, Ser: 1.79 mg/dL — ABNORMAL HIGH (ref 0.44–1.00)
GFR, Estimated: 29 mL/min — ABNORMAL LOW (ref >60)
Glucose, Bld: 108 mg/dL — ABNORMAL HIGH (ref 70–99)
Potassium: 4.3 mmol/L (ref 3.5–5.1)
Sodium: 137 mmol/L (ref 135–145)

## 2024-01-31 LAB — BRAIN NATRIURETIC PEPTIDE: B Natriuretic Peptide: 340.2 pg/mL — ABNORMAL HIGH (ref 0.0–100.0)

## 2024-01-31 MED ORDER — SODIUM CHLORIDE 0.9 % IV BOLUS
500.0000 mL | Freq: Once | INTRAVENOUS | Status: AC
  Administered 2024-01-31: 500 mL via INTRAVENOUS

## 2024-01-31 MED ORDER — IOHEXOL 350 MG/ML SOLN
75.0000 mL | Freq: Once | INTRAVENOUS | Status: AC | PRN
  Administered 2024-01-31: 75 mL via INTRAVENOUS

## 2024-01-31 NOTE — ED Notes (Signed)
 Patient transported to CT

## 2024-01-31 NOTE — ED Provider Triage Note (Signed)
 Emergency Medicine Provider Triage Evaluation Note  Morgan Fitzgerald , a 78 y.o. female  was evaluated in triage.  Pt complains of chest pain and back pain.  Patient reports that she woke up with severe pain in her right shoulder a couple nights ago that then migrated to the left shoulder.  She felt like it was muscular but was not reproducible.  She began having pain in her chest and the last 48 hours have become markedly short of breath with exertion.  Her husband states that this is new.  She also has noticed numbness and tingling in her left hand and down the left arm.  She consulted with a medical professional who told her she might have a pulmonary embolus.  Symptoms are okay at rest but worse when she ambulates.  She denies swelling in the lower extremities, wheezing.  She is a daily smoker..  Review of Systems  Positive: Chest pain back pain Negative: Fever  Physical Exam  BP (!) 164/64   Pulse 65   Temp 98 F (36.7 C)   Resp 20   SpO2 95%  Gen:   Awake, no distress   Resp:  Normal effort  MSK:   Moves extremities without difficulty  Other:    Medical Decision Making  Medically screening exam initiated at 6:31 PM.  Appropriate orders placed.  Morgan Fitzgerald was informed that the remainder of the evaluation will be completed by another provider, this initial triage assessment does not replace that evaluation, and the importance of remaining in the ED until their evaluation is complete.  Patient here with symptoms of chest pain rating to her back.  I have ordered a dissection study given the fact that she has paresthesia of the left arm.  Her shortness of breath does not seem consistent with wheezing COPD or pulmonary edema.  If she does have a pulmonary embolus I felt that it would be better to get a dissection study as we would see any major PEs in the middle or central pulmonary vasculature.  Patient and husband and RN are in agreement with the plan.   Arloa Chroman, PA-C 01/31/24  [id number]

## 2024-01-31 NOTE — ED Triage Notes (Signed)
 PT presents to ED for stiff neck and upper back pain beginning last Thursday that has spread to left arm and chest.  Pt has had some SOB with it as well. Pain has been as high as a 9/10.  No NV.

## 2024-02-01 MED ORDER — IPRATROPIUM-ALBUTEROL 0.5-2.5 (3) MG/3ML IN SOLN
3.0000 mL | Freq: Once | RESPIRATORY_TRACT | Status: AC
  Administered 2024-02-01: 3 mL via RESPIRATORY_TRACT
  Filled 2024-02-01: qty 3

## 2024-02-01 MED ORDER — AEROCHAMBER PLUS FLO-VU LARGE MISC: 1.0000 | Freq: Once | Status: DC

## 2024-02-01 MED ORDER — ALBUTEROL SULFATE HFA 108 (90 BASE) MCG/ACT IN AERS
2.0000 | INHALATION_SPRAY | Freq: Once | RESPIRATORY_TRACT | Status: DC
  Filled 2024-02-01: qty 6.7

## 2024-02-01 NOTE — ED Provider Notes (Signed)
 [place] EMERGENCY DEPARTMENT AT [facility] Provider Note  CSN: [id number] Arrival date & time: 01/31/24 1656  Chief Complaint(s) No chief complaint on file.  HPI Morgan Fitzgerald is a 78 y.o. female with a past medical history listed below who presents to the emergency department with 4 to 5 days of right shoulder pain and central chest pain.  Worse with deep breathing.  Patient also reported shortness of breath with exertion.  Denies any fevers or chills.  No cough or congestion.  Chest pain is not exertional.  Nonradiating.  Pain does migrate/alternate from mid chest location or lower sternal location.  She denies any new lower extremity swelling.  No prolonged immobilization.  HPI  Smokes 5 cigs per day. ~59 yrs   Past Medical History Past Medical History:  Diagnosis Date   Allergic state 01/14/2011   Bronchitis 03/24/2011   Chicken pox as a child   Hyperlipidemia    Hyperlipidemia 03/09/2012   Hypertension    Measles as a child   Preventative health care 01/14/2011   Tobacco abuse 03/24/2011   Patient Active Problem List   Diagnosis Date Noted   Degeneration of lumbar intervertebral disc 12/01/2017   Scoliosis deformity of spine 12/01/2017   Tibialis posterior tendinitis 07/27/2017   Tobacco abuse 03/24/2011   Bronchitis 03/24/2011   Preventative health care 01/14/2011   Allergy 01/14/2011   Chicken pox    Measles    Viral warts 12/10/2009   Mixed hyperlipidemia 12/10/2009   Overweight 12/10/2009   ANEMIA 12/10/2009   ESSENTIAL HYPERTENSION, BENIGN 12/10/2009   RENAL INSUFFICIENCY, CHRONIC 12/10/2009   KNEE PAIN 12/10/2009   NECK PAIN, CHRONIC 12/10/2009   Spina bifida with hydrocephalus (HCC) 12/10/2009   Home Medication(s) Prior to Admission medications   Medication Sig Start Date End Date Taking? Authorizing Provider  aspirin 81 MG tablet Take 81 mg by mouth at bedtime.  Patient not taking: Reported on 12/27/2023    [provider]   atorvastatin (LIPITOR) 10 MG tablet  02/22/20   [provider]  Bioflavonoid Products (BIOFLEX) TABS Take by mouth. Patient not taking: Reported on 12/27/2023    [provider]  Calcium Carbonate (CALCIUM 500 PO) Take 1 tablet by mouth daily.      [provider]  Coenzyme Q10 (CO Q 10 PO) Take by mouth daily.    [provider]  diclofenac  sodium (VOLTAREN ) 1 % GEL APPLY TOPICALLY AS NEEDED Patient not taking: Reported on 12/27/2023 03/09/12   Domenica Harlene LABOR, MD  etodolac (LODINE) 400 MG tablet etodolac 400 mg tablet  TAKE 1 TABLET BY MOUTH TWICE DAILY AS NEEDED Patient not taking: Reported on 12/27/2023    [provider]  fish oil-omega-3 fatty acids 1000 MG capsule Take 2 g by mouth 2 (two) times daily.   Patient not taking: Reported on 12/27/2023    [provider]  Flaxseed, Linseed, (FLAX SEEDS PO) Take 1 capsule by mouth 3 (three) times daily.   Patient not taking: Reported on 12/27/2023    [provider]  fluticasone  (FLONASE ) 50 MCG/ACT nasal spray Place 1 spray into the nose daily as needed for rhinitis. 01/14/11 01/14/12  Domenica Harlene LABOR, MD  FOLIC ACID PO Take 1 tablet by mouth daily.   Patient not taking: Reported on 12/27/2023    [provider]  HYDROcodone-acetaminophen (NORCO/VICODIN) 5-325 MG tablet hydrocodone 5 mg-acetaminophen 325 mg tablet    [provider]  niacin  (NIASPAN ) 500 MG CR tablet Take  4 tablets (2,000 mg total) by mouth at bedtime. Patient not taking: Reported on 12/27/2023 11/22/11   Domenica Harlene LABOR, MD  Plant Sterols and Stanols (CHOLEST OFF PO) Take by mouth. Patient not taking: Reported on 12/27/2023    [provider]  triamterene -hydrochlorothiazide (MAXZIDE-25) 37.5-25 MG per tablet Take 1 each (1 tablet total) by mouth daily. Patient not taking: Reported on 12/27/2023 03/09/12   Domenica Harlene LABOR, MD  VITAMIN C, CALCIUM ASCORBATE, PO Take by mouth. Patient not taking: Reported  on 12/27/2023    [provider]  VITAMIN D, ERGOCALCIFEROL, PO Take 1 tablet by mouth daily.      [provider]  zoster vaccine live, PF, (ZOSTAVAX) 80599 UNT/0.65ML injection Inject 19,400 Units into the skin once. Patient not taking: Reported on 12/27/2023 03/09/12   Domenica Harlene LABOR, MD                                                                                                                                    Allergies Patient has no known allergies.  Review of Systems Review of Systems As noted in HPI  Physical Exam Vital Signs  I have reviewed the triage vital signs BP (!) 148/65   Pulse 75   Temp 98 F (36.7 C)   Resp 16   SpO2 100%   Physical Exam Vitals reviewed.  Constitutional:      General: She is not in acute distress.    Appearance: She is well-developed. She is not diaphoretic.  HENT:     Head: Normocephalic and atraumatic.     Nose: Nose normal.  Eyes:     General: No scleral icterus.       Right eye: No discharge.        Left eye: No discharge.     Conjunctiva/sclera: Conjunctivae normal.     Pupils: Pupils are equal, round, and reactive to light.  Cardiovascular:     Rate and Rhythm: Normal rate and regular rhythm.     Heart sounds: No murmur heard.    No friction rub. No gallop.  Pulmonary:     Effort: Pulmonary effort is normal. No respiratory distress.     Breath sounds: Normal breath sounds. No stridor. No rales.  Chest:     Chest wall: Tenderness present.    Abdominal:     General: There is no distension.     Palpations: Abdomen is soft.     Tenderness: There is no abdominal tenderness.  Musculoskeletal:        General: No tenderness.     Cervical back: Normal range of motion and neck supple.  Skin:    General: Skin is warm and dry.     Findings: No erythema or rash.  Neurological:     Mental Status: She is alert and oriented to person, place, and time.     ED Results and Treatments Labs (all labs ordered  are  listed, but only abnormal results are displayed) Labs Reviewed  BASIC METABOLIC PANEL WITH GFR - Abnormal; Notable for the following components:      Result Value   CO2 21 (*)    Glucose, Bld 108 (*)    BUN 25 (*)    Creatinine, Ser 1.79 (*)    GFR, Estimated 29 (*)    All other components within normal limits  CBC - Abnormal; Notable for the following components:   RBC 3.59 (*)    Hemoglobin 10.7 (*)    HCT 32.8 (*)    All other components within normal limits  BRAIN NATRIURETIC PEPTIDE - Abnormal; Notable for the following components:   B Natriuretic Peptide 340.2 (*)    All other components within normal limits  TROPONIN I (HIGH SENSITIVITY)  TROPONIN I (HIGH SENSITIVITY)                                                                                                                         EKG  EKG Interpretation Date/Time:  Tuesday January 31 2024 17:22:18 EDT Ventricular Rate:  65 PR Interval:  140 QRS Duration:  128 QT Interval:  374 QTC Calculation: 388 R Axis:   56  Text Interpretation: Normal sinus rhythm Right bundle branch block Abnormal ECG No previous ECGs available Confirmed by Trine Likes [phone]) on 02/01/2024 12:57:23 AM       Radiology CT Angio Chest/Abd/Pel for Dissection W and/or Wo Contrast Result Date: 01/31/2024 EXAM: CTA CHEST, ABDOMEN AND PELVIS WITH AND WITHOUT CONTRAST 01/31/2024 08:54:49 PM TECHNIQUE: CTA of the chest was performed with and without the administration of intravenous contrast. CTA of the abdomen and pelvis was performed with and without the administration of intravenous contrast. Multiplanar reformatted images are provided for review. MIP images are provided for review. Automated exposure control, iterative reconstruction, and/or weight based adjustment of the mA/kV was utilized to reduce the radiation dose to as low as reasonably achievable. 75 mL of iohexol (OMNIPAQUE) 350 MG/ML injection was administered. COMPARISON: None  available. CLINICAL HISTORY: Acute aortic syndrome (AAS) suspected. FINDINGS: VASCULATURE: AORTA: Moderate calcified and noncalcified atherosclerotic disease throughout the abdominal aorta. No abdominal aortic aneurysm. No dissection. PULMONARY ARTERIES: No pulmonary embolism with the limits of this exam. GREAT VESSELS OF AORTIC ARCH: No acute finding. No dissection. No arterial occlusion or significant stenosis. CELIAC TRUNK: No acute finding. No occlusion or significant stenosis. 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 The remaining superior mesenteric artery appears within normal limits. INFERIOR MESENTERIC ARTERY: No acute finding. No occlusion or significant stenosis. RENAL ARTERIES: No acute finding. No occlusion or significant stenosis. ILIAC ARTERIES: No acute finding. No occlusion or significant stenosis. CHEST: MEDIASTINUM: No mediastinal lymphadenopathy. The heart and pericardium demonstrate no acute abnormality. LUNGS AND PLEURA: There are atelectatic changes in the lingula and right lower lobe. No focal consolidation or pulmonary edema. No evidence of pleural effusion or pneumothorax. THORACIC BONES AND SOFT TISSUES: There  is an 8 mm hypodense left thyroid  nodule. No acute bone abnormality. ABDOMEN AND PELVIS: LIVER: Hypodensities in the left lobe of the liver are too small to characterize, likely cysts or hemangiomas. GALLBLADDER AND BILE DUCTS: Gallbladder is unremarkable. No biliary ductal dilatation. SPLEEN: The spleen is unremarkable. PANCREAS: The pancreas is unremarkable. ADRENAL GLANDS: Bilateral adrenal glands demonstrate no acute abnormality. KIDNEYS, URETERS AND BLADDER: There are numerous cysts in both kidneys. There is a mildly hyperdense rounded area in the left kidney measuring 14 mm (image 6/167). No stones in the kidneys or ureters. No hydronephrosis. No perinephric or periureteral stranding. Urinary bladder is unremarkable. GI  AND BOWEL: Stomach and duodenal sweep demonstrate no acute abnormality. Appendix is not visualized. There is no bowel obstruction. No abnormal bowel wall thickening or distension. REPRODUCTIVE: Reproductive organs are unremarkable. PERITONEUM AND RETROPERITONEUM: No ascites or free air. LYMPH NODES: No lymphadenopathy. ABDOMINAL BONES AND SOFT TISSUES: Degenerative changes are seen throughout the spine. No acute soft tissue abnormality. IMPRESSION: 1. 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 2. No evidence for aortic dissection or aneurysm. 3. Moderate atherosclerotic disease of the thoracic aorta. 4. Indeterminate 14 mm mildly hyperdense lesion in the left kidney. This may represent a complex cyst and can be further evaluated with ultrasound. Electronically signed by: Greig Pique MD 01/31/2024 09:16 PM EDT RP Workstation: [id number]   DG Chest 2 View Result Date: 01/31/2024 EXAM: 2 VIEW(S) XRAY OF THE CHEST 01/31/2024 07:05:00 PM COMPARISON: None available. CLINICAL HISTORY: sob. CP, SOB FINDINGS: LUNGS AND PLEURA: Right basilar linear atelectasis/scarring. No pulmonary edema. No pleural effusion. No pneumothorax. HEART AND MEDIASTINUM: Aortic arch calcifications. BONES AND SOFT TISSUES: Degenerative changes of shoulders. Thoracic degenerative changes. IMPRESSION: 1. No acute cardiopulmonary process. Electronically signed by: Norman Gatlin MD 01/31/2024 07:10 PM EDT RP Workstation: [id number]    Medications Ordered in ED Medications  AeroChamber Plus Flo-Vu Large MISC 1 each (has no administration in time range)  albuterol (VENTOLIN HFA) 108 (90 Base) MCG/ACT inhaler 2 puff (has no administration in time range)  sodium chloride 0.9 % bolus 500 mL (0 mLs Intravenous Stopped 01/31/24 2143)  iohexol (OMNIPAQUE) 350 MG/ML injection 75 mL (75 mLs Intravenous Contrast Given 01/31/24 2058)  ipratropium-albuterol (DUONEB) 0.5-2.5 (3) MG/3ML nebulizer solution 3 mL (3 mLs  Nebulization Given 02/01/24 0116)   Procedures Procedures  (including critical care time) Medical Decision Making / ED Course   Medical Decision Making Amount and/or Complexity of Data Reviewed Labs: ordered. Decision-making details documented in ED Course. Radiology: ordered and independent interpretation performed. Decision-making details documented in ED Course. ECG/medicine tests: ordered and independent interpretation performed. Decision-making details documented in ED Course.  Risk Prescription drug management. Decision regarding hospitalization.    5 days of right shoulder girdle pain.  Also having central lower chest pain with deep breathing and dyspnea on exertion. Differential diagnosis considered.  Workup below  EKG with right bundle branch block.  No priors for comparison.  No acute ischemic changes, evidence of pericarditis, or dysrhythmias.  Heart score 4.  Initial troponin negative.  Delta troponin negative.  Given duration of pain and atypical nature, ACS is felt to be less likely.  CBC without leukocytosis.  Mild anemia with relatively stable hemoglobin. BMP without significant electrolyte derangements.  Stable renal function. BNP slightly elevated but no evidence of volume overload on exam without cardiomegaly or pulmonary edema on chest x-ray or CTA noted below that would be concerning for heart failure.  Chest x-ray without evidence of pneumonia,  pneumothorax, pulmonary edema pleural effusions.  CTA negative for aortic dissection, obvious pulmonary emboli.  Patient does have severe stenosis of the proximal SMA.  No associated abdominal pain.  Given patient's smoking history, provided with a breathing treatment which resolved her shortness of breath as well as her chest pain.  She was able to ambulate without increased work of breathing and maintain appropriate sats on room air.   Final Clinical Impression(s) / ED Diagnoses Final diagnoses:  Intermittent chest pain   SOB (shortness of breath)  Atherosclerosis   The patient appears reasonably screened and/or stabilized for discharge and I doubt any other medical condition or other [facility] requiring further screening, evaluation, or treatment in the ED at this time. I have discussed the findings, Dx and Tx plan with the patient/family who expressed understanding and agree(s) with the plan. Discharge instructions discussed at length. The patient/family was given strict return precautions who verbalized understanding of the instructions. No further questions at time of discharge.  Disposition: Discharge  Condition: Good  ED Discharge Orders     None       Follow Up: Cleotilde Planas, MD [address] [place] [address] [phone]  Go today as scheduled    This chart was dictated using voice recognition software.  Despite best efforts to proofread,  errors can occur which can change the documentation meaning.    Trine Raynell Moder, MD 02/01/24 [phone]

## 2024-02-01 NOTE — ED Notes (Signed)
 During ambulating, the pt oxygen saturation remained at 99%, but pt was wheezing and short of breath.

## 2024-02-01 NOTE — Discharge Instructions (Addendum)
 Albuterol inhaler: 2-4 puffs every 4-6 hours if needed for wheezing or shortness of breath.  During the workup we noted incidental findings on your imaging that would require you to follow-up with your regular doctor for further evaluation/management: IMPRESSION:  1. 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  2. No evidence for aortic dissection or aneurysm.  3. Moderate atherosclerotic disease of the thoracic aorta.  4. Indeterminate 14 mm mildly hyperdense lesion in the left kidney. This may  represent a complex cyst and can be further evaluated with ultrasound.

## 2024-02-07 ENCOUNTER — Ambulatory Visit: Admitting: Physical Therapy

## 2024-02-14 ENCOUNTER — Encounter: Admitting: Rehabilitative and Restorative Service Providers"

## 2024-02-21 ENCOUNTER — Ambulatory Visit: Attending: Nurse Practitioner | Admitting: Physical Therapy

## 2024-02-21 ENCOUNTER — Encounter: Payer: Self-pay | Admitting: Physical Therapy

## 2024-02-21 DIAGNOSIS — M6281 Muscle weakness (generalized): Secondary | ICD-10-CM | POA: Diagnosis present

## 2024-02-21 DIAGNOSIS — R293 Abnormal posture: Secondary | ICD-10-CM | POA: Diagnosis present

## 2024-02-21 DIAGNOSIS — M5459 Other low back pain: Secondary | ICD-10-CM | POA: Diagnosis present

## 2024-02-21 DIAGNOSIS — R2689 Other abnormalities of gait and mobility: Secondary | ICD-10-CM | POA: Insufficient documentation

## 2024-02-21 NOTE — Therapy (Signed)
 OUTPATIENT PHYSICAL THERAPY TREATMENT NOTE/ RE-CERTIFICATION   Patient Name: Morgan Fitzgerald MRN: [id number] DOB:Sep 22, 1945, 78 y.o., female Today's Date: 02/21/2024  END OF SESSION:  PT End of Session - 02/21/24 1230     Visit Number 4    Date for Recertification  04/17/24    Authorization Type [employer] MCR    Progress Note Due on Visit 10    PT Start Time 1153    PT Stop Time 1227    PT Time Calculation (min) 34 min    Activity Tolerance Patient tolerated treatment well    Behavior During Therapy [facility] for tasks assessed/performed            Past Medical History:  Diagnosis Date   Allergic state 01/14/2011   Bronchitis 03/24/2011   Chicken pox as a child   Hyperlipidemia    Hyperlipidemia 03/09/2012   Hypertension    Measles as a child   Preventative health care 01/14/2011   Tobacco abuse 03/24/2011   Past Surgical History:  Procedure Laterality Date   BREAST SURGERY  1997   breast biopsy, right   carpal tunnel release, b/l  1990's   right foot bunionectomy  2010   4 pins in lateral right foot   right shoulder repai  1966   s/p fracture that healed poorly   TONSILLECTOMY AND ADENOIDECTOMY     total knee replacement, right     TUBAL LIGATION     Patient Active Problem List   Diagnosis Date Noted   Degeneration of lumbar intervertebral disc 12/01/2017   Scoliosis deformity of spine 12/01/2017   Tibialis posterior tendinitis 07/27/2017   Tobacco abuse 03/24/2011   Bronchitis 03/24/2011   Preventative health care 01/14/2011   Allergy 01/14/2011   Chicken pox    Measles    Viral warts 12/10/2009   Mixed hyperlipidemia 12/10/2009   Overweight 12/10/2009   ANEMIA 12/10/2009   ESSENTIAL HYPERTENSION, BENIGN 12/10/2009   RENAL INSUFFICIENCY, CHRONIC 12/10/2009   KNEE PAIN 12/10/2009   NECK PAIN, CHRONIC 12/10/2009   Spina bifida with hydrocephalus (HCC) 12/10/2009    PCP: Cleotilde Planas, MD   REFERRING PROVIDER: Gretel Piedra, NP  REFERRING DIAG:   Diagnosis  M51.360 (ICD-10-CM) - Other intervertebral disc degeneration, lumbar region with discogenic back pain only    Rationale for Evaluation and Treatment: Rehabilitation  THERAPY DIAG:  Other low back pain - Plan: PT plan of care cert/re-cert  Abnormal posture - Plan: PT plan of care cert/re-cert  Muscle weakness (generalized) - Plan: PT plan of care cert/re-cert  Other abnormalities of gait and mobility - Plan: PT plan of care cert/re-cert  ONSET DATE: Chronic  SUBJECTIVE:  SUBJECTIVE STATEMENT: Patient verbalized feeling 60% better since starting skilled therapy.  PERTINENT HISTORY:  Hx right TKA; HTN; Scoliosis; Hx right ankle fusion  PAIN:  Are you having pain? Yes: NPRS scale: 7/10 Pain location: Lower back both sides Pain description: constant dull; achy Aggravating factors: getting out of bed; walking & standing; bending Relieving factors: resting (not doing any yard or cleaning)  PRECAUTIONS: None  RED FLAGS: None   WEIGHT BEARING RESTRICTIONS: No  FALLS:  Has patient fallen in last 6 months? 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  DIAGNOSTIC FINDINGS:  02/2018 Lumbar X ray IMPRESSION: 1. Dextroconvex lumbar scoliosis with diffuse advanced lumbar disc and endplate degeneration. Widespread posterior element hypertrophy. Lower thoracic disc degeneration. 2. Degenerative edema in the L1 and L2 vertebral bodies. No other No acute osseous abnormality identified. 3. Moderate to severe multifactorial spinal stenosis at L1-L2 which is the level of the conus medullaris. No conus or lower thoracic spinal cord signal abnormality. 4. Mild spinal stenosis at L1-L2 and T11-T12. 5. Moderate to severe neural foraminal stenosis at the left T11, left L2, right L3, right L4, and bilateral L5 nerve levels.  PATIENT SURVEYS:  ODI: 17/50 34%  Minimally Clinically Important Difference (MCID) = 12.8%  11/4 ODI: 15/50 30%  COGNITION: Overall cognitive status: Within functional limits for tasks assessed     SENSATION: Numbness in right leg that goes down to her feet    POSTURE: rounded shoulders, forward head, and Lateral trunk lean to the right   PALPATION: Lumbar: Concave to the right ;convex to the left lumbar Left pelvis elevated  LUMBAR ROM: Not assessed at eval  AROM eval  Flexion   Extension   Right lateral flexion   Left lateral flexion   Right rotation   Left rotation    (Blank rows = not tested)  LOWER EXTREMITY ROM:   WFL bilateral    LOWER EXTREMITY MMT:    MMT Right eval Left eval  Hip flexion 4- 4-  Hip extension    Hip abduction 4 4-  Hip adduction    Hip internal rotation    Hip external rotation    Knee flexion 4+ 4+  Knee extension 4+ 4-  Ankle dorsiflexion    Ankle plantarflexion    Ankle inversion    Ankle eversion     (Blank rows = not tested)   FUNCTIONAL TESTS:  Eval: 5 times sit to stand: 9.69 sec no UE support ; uncontrolled descent Timed up and go  (TUG): 13.44 sec   01/04/2024: 3 minute walk test:  285 ft with pain increasing to 8/10, RPE of 8/10 02/21/2024 TUG:10.39 sec 3MWT:404 ft RPE 8/10 (patient felt like her back was getting weak)  GAIT:  Comments: right lateral trunk lean, Wide BOS  TREATMENT DATE:  02/21/2024: : 404 ft RPE 8/10 (patient felt like her back was getting weak) ODI: 15/50 30% TUG 10.39sec Seated ear to hip 5# x 10 each direction BP: 156/72 HR 63 Seated chest press 5# 2 x 12 Standing lateral trunk flexion to the left holding 5 # DB x 12 Pallof press with red TB x 12 each direction Seated alt hand and knee press with small ball x 10 bilateral  Standing shoulder row with red TB 2 x 10     01/27/2024: NuStep level 3 6 mins - PT present to discuss status Seated 3 way green pball rollout x10 each direction Seated cervical rotation x 8 5 sec hold Seated scap retraction x 12 Shoulder circles x 10 (forwards only; back wards was painful) Manual STM to right upper trap and periscapular muscles then educated patient in self assisted manual techniques with lacrosse ball Standing open book x 8 each side Seated transversus abdominus contraction by pressing ball into thighs 2x10 Seated alt hand and knee press with small ball x 10 bilateral  Standing lateral trunk flexion to the left holding 4 # DB x 12 Sitting semi reclines hip to hip with 4# DB x 10 each side Seated hip adduction ball squeeze + TA activation 2x10 Seated shoulder ER with yellow TB 2 x 10    01/04/2024: Seated transversus abdominus contraction by pressing ball into thighs 2x10 Seated hip adduction ball squeeze 2x10 Seated 3 way green pball rollout x10 each direction Seated hamstring stretch 2x20 sec bilat 3 minute walk test:  285 ft with pain increasing to 8/10, RPE of 8/10 Seated long arc quad 2x10 bilat Seated heel/toe raises x20 Nustep level 3 x6 min with PT present to discuss status Seated scapular retraction x10 (with 5 sec  hold)   12/27/2023  Initial Evaluation & HEP created                                                                                                                                 PATIENT EDUCATION:  Education details: PT eval findings, anticipated POC, progress with PT, and initial HEP Person educated: Patient Education method: Explanation, Demonstration, and Handouts Education comprehension: verbalized understanding, returned demonstration, and needs further education  HOME EXERCISE PROGRAM: Access Code: [email or web address] URL: [email or web address] Date: 01/27/2024 Prepared by: Kristeen Sar  Exercises - Sidelying Diaphragmatic Breathing  - 1 x daily - 7 x weekly - 1 sets - 5 reps - 3 hold - Seated Alternating Side Stretch with Arm Overhead  - 1 x daily - 7 x weekly - 3 sets - 10s hold - Supine Lower Trunk Rotation  - 1 x daily - 7 x weekly - 1 sets - 8 reps - 4s hold - Seated Transversus Abdominis Bracing  - 1 x daily - 7 x weekly - 1-2 sets - 10 reps - Seated Hamstring Stretch  - 1 x daily - 7 x weekly - 2  reps - 20 sec hold - Seated Scapular Retraction  - 1 x daily - 7 x weekly - 2 sets - 10 reps - Shoulder External Rotation with Resistance  - 1 x daily - 7 x weekly - 2 sets - 10 reps  ASSESSMENT:  CLINICAL IMPRESSION: Debbie verbalized feeling 60% better since starting skilled therapy. Over the last 8 weeks she has only had 4 session due to illness. She is still greatly challenged in her standing tolerance. She is only able to stand for 4 mins comfortably before needing to find a seat.  Educated patient on tactics to try to improve her standing tolerance. Noted great improvements on patient's TUG and since starting skilled therapy. She verbalized the same RPE, but she walked 119 ft further. Patient would benefit from continued therapy to meet remaining goals, continue core strengthening and working on improving standing tolerance.      OBJECTIVE IMPAIRMENTS: Abnormal  gait, decreased activity tolerance, decreased balance, decreased mobility, difficulty walking, decreased ROM, decreased strength, hypomobility, increased muscle spasms, impaired flexibility, impaired sensation, improper body mechanics, postural dysfunction, and pain.   ACTIVITY LIMITATIONS: lifting, bending, standing, squatting, sleeping, stairs, transfers, bed mobility, continence, toileting, dressing, hygiene/grooming, and locomotion level  PARTICIPATION LIMITATIONS: meal prep, cleaning, laundry, interpersonal relationship, shopping, community activity, occupation, and yard work  PERSONAL FACTORS: Age, Fitness, Time since onset of injury/illness/exacerbation, and 1-2 comorbidities: HTN; Scoliosis are also affecting patient's functional outcome.   REHAB POTENTIAL: Good  CLINICAL DECISION MAKING: Evolving/moderate complexity  EVALUATION COMPLEXITY: Moderate   GOALS: Goals reviewed with patient? Yes  SHORT TERM GOALS: Target date: 01/24/2024  Patient will be independent with initial HEP. Baseline:  Goal status: met 02/21/2024  2.  Patient will be able participate in the 3 or 6 MWT to establish a baseline for the test. Baseline:  Goal status: Met on 01/04/2024 (see above)  3.  Patient will demonstrate correct hinging technique to prevent mechanical strain and pain during lifting and bending activities. Baseline:  Goal status: IN PROGRESS 02/21/2024    LONG TERM GOALS: Target date: 04/17/2024  Patient will demonstrate independence in advanced HEP. Baseline:  Goal status: IN PROGRESS 02/21/2024  2.  Patient will report > or = to 30% improvement in functional mobility since starting PT. Baseline:  Goal status: met (60%) 02/21/2024  3.  Patient will verbalize and demonstrate self-care strategies to manage pain including tissue mobility practices and change of position. Baseline:  Goal status: IN PROGRESS 02/21/2024  4.  Patient will score < or = to 12 sec on TUG to decrease falls  risk. Baseline: 13.44 sec Goal status: MET 02/21/2024  5.  Patient will be able to walk for at least 6 mins to improve functional mobility and community ambulation. Baseline:  Goal status: IN PROGRESS (3 MINS ) 02/21/2024  5.  Patient will be able to stand for at least 5-6 mins for improved performance of self care and ADLS. Baseline:  Goal status: NEW   PLAN:  PT FREQUENCY: 1-2x/week  PT DURATION: 8 weeks  PLANNED INTERVENTIONS: [id number]- PT Re-evaluation, 97110-Therapeutic exercises, 97530- Therapeutic activity, [id number]- Neuromuscular re-education, 97535- Self Care, [id number]- Manual therapy, [phone]- Gait training, [phone]- Canalith repositioning, [id number]- Aquatic Therapy, [phone]- Electrical stimulation (unattended), [phone]- Electrical stimulation (manual), [id number]- Vasopneumatic device, [id number]- Ultrasound, [id number]- Traction (mechanical), [id number]- Ionotophoresis 4mg /ml Dexamethasone , [id number] (1-2 muscles), [id number] (3+ muscles)- Dry Needling, Patient/Family education, Balance training, Stair training, Taping, Joint mobilization, Joint manipulation, Spinal manipulation, Spinal mobilization, Scar mobilization, Compression bandaging, Vestibular training, Cryotherapy,  and Moist heat.  PLAN FOR NEXT SESSION: core strengthening; seated hinging; strengthening, flexibility, manual/dry needling as indicated     Kristeen Sar, PT, DPT 02/21/24 12:31 PM [facility] [address], [address] [place], [place] [address] Phone # [phone] Fax [phone]

## 2024-02-28 ENCOUNTER — Encounter: Payer: Self-pay | Admitting: Physical Therapy

## 2024-02-28 ENCOUNTER — Ambulatory Visit: Admitting: Physical Therapy

## 2024-02-28 DIAGNOSIS — M6281 Muscle weakness (generalized): Secondary | ICD-10-CM

## 2024-02-28 DIAGNOSIS — M5459 Other low back pain: Secondary | ICD-10-CM | POA: Diagnosis not present

## 2024-02-28 DIAGNOSIS — R293 Abnormal posture: Secondary | ICD-10-CM

## 2024-02-28 DIAGNOSIS — R2689 Other abnormalities of gait and mobility: Secondary | ICD-10-CM

## 2024-02-28 NOTE — Therapy (Signed)
 OUTPATIENT PHYSICAL THERAPY TREATMENT NOTE  Patient Name: Morgan Fitzgerald MRN: [id number] DOB:[birth year], 78 y.o., female Today's Date: 02/28/2024  END OF SESSION:  PT End of Session - 02/28/24 1250     Visit Number 5    Date for Recertification  04/17/24    Authorization Type [employer] MCR    Progress Note Due on Visit 10    PT Start Time 1154    PT Stop Time 1230    PT Time Calculation (min) 36 min    Activity Tolerance Patient tolerated treatment well    Behavior During Therapy [facility] for tasks assessed/performed             Past Medical History:  Diagnosis Date   Allergic state 01/14/2011   Bronchitis 03/24/2011   Chicken pox as a child   Hyperlipidemia    Hyperlipidemia 03/09/2012   Hypertension    Measles as a child   Preventative health care 01/14/2011   Tobacco abuse 03/24/2011   Past Surgical History:  Procedure Laterality Date   BREAST SURGERY  1997   breast biopsy, right   carpal tunnel release, b/l  1990's   right foot bunionectomy  2010   4 pins in lateral right foot   right shoulder repai  1966   s/p fracture that healed poorly   TONSILLECTOMY AND ADENOIDECTOMY     total knee replacement, right     TUBAL LIGATION     Patient Active Problem List   Diagnosis Date Noted   Degeneration of lumbar intervertebral disc 12/01/2017   Scoliosis deformity of spine 12/01/2017   Tibialis posterior tendinitis 07/27/2017   Tobacco abuse 03/24/2011   Bronchitis 03/24/2011   Preventative health care 01/14/2011   Allergy 01/14/2011   Chicken pox    Measles    Viral warts 12/10/2009   Mixed hyperlipidemia 12/10/2009   Overweight 12/10/2009   ANEMIA 12/10/2009   ESSENTIAL HYPERTENSION, BENIGN 12/10/2009   RENAL INSUFFICIENCY, CHRONIC 12/10/2009   KNEE PAIN 12/10/2009   NECK PAIN, CHRONIC 12/10/2009   Spina bifida with hydrocephalus (HCC) 12/10/2009    PCP: Cleotilde Planas, MD   REFERRING PROVIDER: Gretel Piedra, NP  REFERRING DIAG:  Diagnosis  M51.360  (ICD-10-CM) - Other intervertebral disc degeneration, lumbar region with discogenic back pain only    Rationale for Evaluation and Treatment: Rehabilitation  THERAPY DIAG:  Other low back pain  Abnormal posture  Muscle weakness (generalized)  Other abnormalities of gait and mobility  ONSET DATE: Chronic  SUBJECTIVE:  SUBJECTIVE STATEMENT: Patient presents with increased pain today. She verbalized 9/10 pain. She hurt a lot after last treatment session.  PERTINENT HISTORY:  Hx right TKA; HTN; Scoliosis; Hx right ankle fusion  PAIN:  Are you having pain? Yes: NPRS scale: 7/10 Pain location: Lower back both sides Pain description: constant dull; achy Aggravating factors: getting out of bed; walking & standing; bending Relieving factors: resting (not doing any yard or cleaning)  PRECAUTIONS: None  RED FLAGS: None   WEIGHT BEARING RESTRICTIONS: No  FALLS:  Has patient fallen in last 6 months? 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  DIAGNOSTIC FINDINGS:  02/2018 Lumbar X ray IMPRESSION: 1. Dextroconvex lumbar scoliosis with diffuse advanced lumbar disc and endplate degeneration. Widespread posterior element hypertrophy. Lower thoracic disc degeneration. 2. Degenerative edema in the L1 and L2 vertebral bodies. No other No acute osseous abnormality identified. 3. Moderate to severe multifactorial spinal stenosis at L1-L2 which is the level of the conus medullaris. No conus or lower thoracic spinal cord signal abnormality. 4. Mild spinal stenosis at L1-L2 and T11-T12. 5. Moderate to severe neural foraminal stenosis at the left T11, left L2, right L3, right L4, and bilateral L5 nerve levels.  PATIENT SURVEYS:  ODI: 17/50 34%  Minimally Clinically Important Difference (MCID) = 12.8%  11/4 ODI: 15/50 30%  COGNITION: Overall cognitive status: Within functional limits for tasks assessed     SENSATION: Numbness in right leg that goes down to her feet    POSTURE: rounded shoulders, forward head, and Lateral trunk lean to the right   PALPATION: Lumbar: Concave to the right ;convex to the left lumbar Left pelvis elevated  LUMBAR ROM: Not assessed at eval  AROM eval  Flexion   Extension   Right lateral flexion   Left lateral flexion   Right rotation   Left rotation    (Blank rows = not tested)  LOWER EXTREMITY ROM:   WFL bilateral    LOWER EXTREMITY MMT:    MMT Right eval Left eval  Hip flexion 4- 4-  Hip extension    Hip abduction 4 4-  Hip adduction    Hip internal rotation    Hip external rotation    Knee flexion 4+ 4+  Knee extension 4+ 4-  Ankle dorsiflexion    Ankle plantarflexion    Ankle inversion    Ankle eversion     (Blank rows = not tested)   FUNCTIONAL TESTS:  Eval: 5 times sit to stand: 9.69 sec no UE support ; uncontrolled descent Timed up and go (TUG): 13.44 sec   01/04/2024: 3 minute walk test:  285 ft with pain increasing to 8/10, RPE of  8/10 02/21/2024 TUG:10.39 sec 3MWT:404 ft RPE 8/10 (patient felt like her back was getting weak)  GAIT:  Comments: right lateral trunk lean, Wide BOS   TREATMENT DATE:  02/28/2024: NuStep Level 4 6 mins- PT present to discuss status QL stretch with peanut ball 3 x 10 sec bilateral  Seated hamstring stretch 2 x 20 sec bilateral  Seated TA activation ball press 2 x 10 Seated TA activaiton + hip adduction 2 x 10 Seated TA activation + hip abduction with red loop Seated ear to hip 4# x 8 each direction Sit to stand holding 4# DB x 12    02/21/2024: : 404 ft RPE 8/10 (patient felt like her back was getting weak) ODI: 15/50 30% TUG 10.39sec Seated ear to hip 5# x 10 each direction BP: 156/72 HR 63 Seated chest press 5# 2 x 12 Standing lateral trunk flexion to the left holding 5 # DB x 12 Pallof press with red TB x 12 each direction Seated alt hand and knee press with small ball x 10 bilateral  Standing shoulder row with red TB 2 x 10     01/27/2024: NuStep level 3 6 mins - PT present to discuss status Seated 3 way green pball rollout x10 each direction Seated cervical rotation x 8 5 sec hold Seated scap retraction x 12 Shoulder circles x 10 (forwards only; back wards was painful) Manual STM to right upper trap and periscapular muscles then educated patient in self assisted manual techniques with lacrosse ball Standing open book x 8 each side Seated transversus abdominus contraction by pressing ball into thighs 2x10 Seated alt hand and knee press with small ball x 10 bilateral  Standing lateral trunk flexion to the left holding 4 # DB x 12 Sitting semi reclines hip to hip with 4# DB x 10 each side Seated hip adduction ball squeeze + TA activation 2x10 Seated shoulder ER with yellow TB 2 x 10    01/04/2024: Seated transversus abdominus contraction by pressing ball into thighs 2x10 Seated hip adduction ball squeeze 2x10 Seated 3 way green pball rollout x10 each  direction Seated hamstring stretch 2x20 sec bilat 3 minute walk test:  285 ft with pain increasing to 8/10, RPE of 8/10 Seated long arc quad 2x10 bilat Seated heel/toe raises x20 Nustep level 3 x6 min with PT present to discuss status Seated scapular retraction x10 (with 5 sec hold)   PATIENT EDUCATION:  Education details: PT eval findings, anticipated POC, progress with PT, and initial HEP Person educated: Patient Education method: Explanation, Demonstration, and Handouts Education comprehension: verbalized understanding, returned demonstration, and needs further education  HOME EXERCISE PROGRAM: Access Code: [email or web address] URL: [email or web address] Date: 01/27/2024 Prepared by: Kristeen Sar  Exercises - Sidelying Diaphragmatic Breathing  - 1 x daily - 7 x weekly - 1 sets - 5 reps - 3 hold - Seated Alternating Side Stretch with Arm Overhead  - 1 x daily - 7 x weekly - 3 sets - 10s hold - Supine Lower Trunk Rotation  - 1 x daily - 7 x weekly - 1 sets - 8 reps - 4s hold - Seated Transversus Abdominis Bracing  - 1 x daily - 7 x weekly - 1-2 sets - 10 reps - Seated Hamstring Stretch  - 1 x daily - 7 x weekly - 2 reps - 20 sec hold - Seated Scapular Retraction  - 1 x daily - 7 x weekly - 2 sets - 10 reps - Shoulder External Rotation with Resistance  - 1 x daily - 7 x weekly - 2 sets - 10 reps  ASSESSMENT:  CLINICAL  IMPRESSION: Marval presents with increased back pain today. She verbalized having increased pain after last treatment session. Decreased exercise intensity today and did not incorporate many UE movements. Treatment session focused on TA activation with LE movements. Patient required verbal cues for correct breathing technique while performing exercises. Patient did not verbalize any increased pain while performing exercises. Will assess patient's tolerance to treatment session and progress as able.     OBJECTIVE IMPAIRMENTS: Abnormal gait, decreased activity  tolerance, decreased balance, decreased mobility, difficulty walking, decreased ROM, decreased strength, hypomobility, increased muscle spasms, impaired flexibility, impaired sensation, improper body mechanics, postural dysfunction, and pain.   ACTIVITY LIMITATIONS: lifting, bending, standing, squatting, sleeping, stairs, transfers, bed mobility, continence, toileting, dressing, hygiene/grooming, and locomotion level  PARTICIPATION LIMITATIONS: meal prep, cleaning, laundry, interpersonal relationship, shopping, community activity, occupation, and yard work  PERSONAL FACTORS: Age, Fitness, Time since onset of injury/illness/exacerbation, and 1-2 comorbidities: HTN; Scoliosis are also affecting patient's functional outcome.   REHAB POTENTIAL: Good  CLINICAL DECISION MAKING: Evolving/moderate complexity  EVALUATION COMPLEXITY: Moderate   GOALS: Goals reviewed with patient? Yes  SHORT TERM GOALS: Target date: 01/24/2024  Patient will be independent with initial HEP. Baseline:  Goal status: met 02/21/2024  2.  Patient will be able participate in the 3 or 6 MWT to establish a baseline for the test. Baseline:  Goal status: Met on 01/04/2024 (see above)  3.  Patient will demonstrate correct hinging technique to prevent mechanical strain and pain during lifting and bending activities. Baseline:  Goal status: IN PROGRESS 02/21/2024    LONG TERM GOALS: Target date: 04/17/2024  Patient will demonstrate independence in advanced HEP. Baseline:  Goal status: IN PROGRESS 02/21/2024  2.  Patient will report > or = to 30% improvement in functional mobility since starting PT. Baseline:  Goal status: met (60%) 02/21/2024  3.  Patient will verbalize and demonstrate self-care strategies to manage pain including tissue mobility practices and change of position. Baseline:  Goal status: IN PROGRESS 02/21/2024  4.  Patient will score < or = to 12 sec on TUG to decrease falls risk. Baseline: 13.44  sec Goal status: MET 02/21/2024  5.  Patient will be able to walk for at least 6 mins to improve functional mobility and community ambulation. Baseline:  Goal status: IN PROGRESS (3 MINS ) 02/21/2024  5.  Patient will be able to stand for at least 5-6 mins for improved performance of self care and ADLS. Baseline:  Goal status: NEW   PLAN:  PT FREQUENCY: 1-2x/week  PT DURATION: 8 weeks  PLANNED INTERVENTIONS: [id number]- PT Re-evaluation, 97110-Therapeutic exercises, 97530- Therapeutic activity, [id number]- Neuromuscular re-education, 97535- Self Care, [id number]- Manual therapy, [id number]- Gait training, [phone]- Canalith repositioning, [id number]- Aquatic Therapy, [phone]- Electrical stimulation (unattended), [phone]- Electrical stimulation (manual), [id number]- Vasopneumatic device, [id number]- Ultrasound, [id number]- Traction (mechanical), [id number]- Ionotophoresis 4mg /ml Dexamethasone , [id number] (1-2 muscles), [id number] (3+ muscles)- Dry Needling, Patient/Family education, Balance training, Stair training, Taping, Joint mobilization, Joint manipulation, Spinal manipulation, Spinal mobilization, Scar mobilization, Compression bandaging, Vestibular training, Cryotherapy, and Moist heat.  PLAN FOR NEXT SESSION: assess tolerance to treatment session; continue lower intensity exercises if needed; core strengthening; seated hinging; strengthening, flexibility   Kristeen Sar, PT, DPT 02/28/24 12:52 PM [facility] [address], [address] [place], [place] [address] Phone # [phone] Fax [phone]

## 2024-03-08 ENCOUNTER — Ambulatory Visit: Admitting: Physical Therapy

## 2024-03-19 ENCOUNTER — Ambulatory Visit: Attending: Nurse Practitioner | Admitting: Physical Therapy

## 2024-03-19 ENCOUNTER — Encounter: Payer: Self-pay | Admitting: Physical Therapy

## 2024-03-19 DIAGNOSIS — M5459 Other low back pain: Secondary | ICD-10-CM | POA: Diagnosis present

## 2024-03-19 DIAGNOSIS — M6281 Muscle weakness (generalized): Secondary | ICD-10-CM | POA: Insufficient documentation

## 2024-03-19 DIAGNOSIS — R2689 Other abnormalities of gait and mobility: Secondary | ICD-10-CM | POA: Insufficient documentation

## 2024-03-19 DIAGNOSIS — R293 Abnormal posture: Secondary | ICD-10-CM | POA: Diagnosis present

## 2024-03-19 NOTE — Therapy (Signed)
 OUTPATIENT PHYSICAL THERAPY TREATMENT NOTE  Patient Name: Morgan Fitzgerald MRN: [id number] DOB:[birth year], 78 y.o., female Today's Date: 03/19/2024  END OF SESSION:  PT End of Session - 03/19/24 1406     Visit Number 6    Date for Recertification  04/17/24    Authorization Type [employer] MCR    Progress Note Due on Visit 10    PT Start Time 1156   Arrival time   PT Stop Time 1233    PT Time Calculation (min) 37 min    Activity Tolerance Patient tolerated treatment well    Behavior During Therapy [facility] for tasks assessed/performed              Past Medical History:  Diagnosis Date   Allergic state 01/14/2011   Bronchitis 03/24/2011   Chicken pox as a child   Hyperlipidemia    Hyperlipidemia 03/09/2012   Hypertension    Measles as a child   Preventative health care 01/14/2011   Tobacco abuse 03/24/2011   Past Surgical History:  Procedure Laterality Date   BREAST SURGERY  1997   breast biopsy, right   carpal tunnel release, b/l  1990's   right foot bunionectomy  2010   4 pins in lateral right foot   right shoulder repai  1966   s/p fracture that healed poorly   TONSILLECTOMY AND ADENOIDECTOMY     total knee replacement, right     TUBAL LIGATION     Patient Active Problem List   Diagnosis Date Noted   Degeneration of lumbar intervertebral disc 12/01/2017   Scoliosis deformity of spine 12/01/2017   Tibialis posterior tendinitis 07/27/2017   Tobacco abuse 03/24/2011   Bronchitis 03/24/2011   Preventative health care 01/14/2011   Allergy 01/14/2011   Chicken pox    Measles    Viral warts 12/10/2009   Mixed hyperlipidemia 12/10/2009   Overweight 12/10/2009   ANEMIA 12/10/2009   ESSENTIAL HYPERTENSION, BENIGN 12/10/2009   RENAL INSUFFICIENCY, CHRONIC 12/10/2009   KNEE PAIN 12/10/2009   NECK PAIN, CHRONIC 12/10/2009   Spina bifida with hydrocephalus (HCC) 12/10/2009    PCP: Cleotilde Planas, MD   REFERRING PROVIDER: Gretel Piedra, NP  REFERRING DIAG:  Diagnosis   M51.360 (ICD-10-CM) - Other intervertebral disc degeneration, lumbar region with discogenic back pain only    Rationale for Evaluation and Treatment: Rehabilitation  THERAPY DIAG:  Other low back pain  Abnormal posture  Muscle weakness (generalized)  Other abnormalities of gait and mobility  ONSET DATE: Chronic  SUBJECTIVE:  SUBJECTIVE STATEMENT: Patient presents with increased right ankle, knee, and low back pain today. Low back pain 8/10.  PERTINENT HISTORY:  Hx right TKA; HTN; Scoliosis; Hx right ankle fusion  PAIN:  Are you having pain? Yes: NPRS scale: 7/10 Pain location: Lower back both sides Pain description: constant dull; achy Aggravating factors: getting out of bed; walking & standing; bending Relieving factors: resting (not doing any yard or cleaning)  PRECAUTIONS: None  RED FLAGS: None   WEIGHT BEARING RESTRICTIONS: No  FALLS:  Has patient fallen in last 6 months? 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  DIAGNOSTIC  FINDINGS:  02/2018 Lumbar X ray IMPRESSION: 1. Dextroconvex lumbar scoliosis with diffuse advanced lumbar disc and endplate degeneration. Widespread posterior element hypertrophy. Lower thoracic disc degeneration. 2. Degenerative edema in the L1 and L2 vertebral bodies. No other No acute osseous abnormality identified. 3. Moderate to severe multifactorial spinal stenosis at L1-L2 which is the level of the conus medullaris. No conus or lower thoracic spinal cord signal abnormality. 4. Mild spinal stenosis at L1-L2 and T11-T12. 5. Moderate to severe neural foraminal stenosis at the left T11, left L2, right L3, right L4, and bilateral L5 nerve levels.  PATIENT SURVEYS:  ODI: 17/50 34%  Minimally Clinically Important Difference (MCID) = 12.8%  11/4 ODI: 15/50 30%  COGNITION: Overall cognitive status: Within functional limits for tasks assessed     SENSATION: Numbness in right leg that goes down to her feet    POSTURE: rounded shoulders, forward head, and Lateral trunk lean to the right   PALPATION: Lumbar: Concave to the right ;convex to the left lumbar Left pelvis elevated  LUMBAR ROM: Not assessed at eval  AROM eval  Flexion   Extension   Right lateral flexion   Left lateral flexion   Right rotation   Left rotation    (Blank rows = not tested)  LOWER EXTREMITY ROM:   WFL bilateral    LOWER EXTREMITY MMT:    MMT Right eval Left eval  Hip flexion 4- 4-  Hip extension    Hip abduction 4 4-  Hip adduction    Hip internal rotation    Hip external rotation    Knee flexion 4+ 4+  Knee extension 4+ 4-  Ankle dorsiflexion    Ankle plantarflexion    Ankle inversion    Ankle eversion     (Blank rows = not tested)   FUNCTIONAL TESTS:  Eval: 5 times sit to stand: 9.69 sec no UE support ; uncontrolled descent Timed up and go (TUG): 13.44 sec   01/04/2024: 3 minute walk test:  285 ft with pain increasing to 8/10, RPE of 8/10 02/21/2024 TUG:10.39  sec 3MWT:404 ft RPE 8/10 (patient felt like her back was getting weak)  GAIT:  Comments: right  lateral trunk lean, Wide BOS   TREATMENT DATE:  03/19/2024: Patient was late to appointment NuStep Level 4 7 mins- PT present to discuss status Seated hamstring stretch 2 x 20 sec bilateral  Seated figure four with bottom leg straight 2 x 20 sec bilateral  3 way stability ball stretch x 10 each direction Seated hip to hip 4# DB 2 x 10 Seated chest press + TA activation with 4# DB 2 x 10   02/28/2024: NuStep Level 4 6 mins- PT present to discuss status QL stretch with peanut ball 3 x 10 sec bilateral  Seated hamstring stretch 2 x 20 sec bilateral  Seated TA activation ball press 2 x 10 Seated TA activaiton + hip adduction 2 x 10 Seated TA activation + hip abduction with red loop Seated ear to hip 4# x 8 each direction Sit to stand holding 4# DB x 12    02/21/2024: : 404 ft RPE 8/10 (patient felt like her back was getting weak) ODI: 15/50 30% TUG 10.39sec Seated ear to hip 5# x 10 each direction BP: 156/72 HR 63 Seated chest press 5# 2 x 12 Standing lateral trunk flexion to the left holding 5 # DB x 12 Pallof press with red TB x 12 each direction Seated alt hand and knee press with small ball x 10 bilateral  Standing shoulder row with red TB 2 x 10     01/27/2024: NuStep level 3 6 mins - PT present to discuss status Seated 3 way green pball rollout x10 each direction Seated cervical rotation x 8 5 sec hold Seated scap retraction x 12 Shoulder circles x 10 (forwards only; back wards was painful) Manual STM to right upper trap and periscapular muscles then educated patient in self assisted manual techniques with lacrosse ball Standing open book x 8 each side Seated transversus abdominus contraction by pressing ball into thighs 2x10 Seated alt hand and knee press with small ball x 10 bilateral  Standing lateral trunk flexion to the left holding 4 # DB x 12 Sitting semi  reclines hip to hip with 4# DB x 10 each side Seated hip adduction ball squeeze + TA activation 2x10 Seated shoulder ER with yellow TB 2 x 10    PATIENT EDUCATION:  Education details: PT eval findings, anticipated POC, progress with PT, and initial HEP Person educated: Patient Education method: Explanation, Demonstration, and Handouts Education comprehension: verbalized understanding, returned demonstration, and needs further education  HOME EXERCISE PROGRAM: Access Code: [email or web address] URL: [email or web address] Date: 01/27/2024 Prepared by: Kristeen Sar  Exercises - Sidelying Diaphragmatic Breathing  - 1 x daily - 7 x weekly - 1 sets - 5 reps - 3 hold - Seated Alternating Side Stretch with Arm Overhead  - 1 x daily - 7 x weekly - 3 sets - 10s hold - Supine Lower Trunk Rotation  - 1 x daily - 7 x weekly - 1 sets - 8 reps - 4s hold - Seated Transversus Abdominis Bracing  - 1 x daily - 7 x weekly - 1-2 sets - 10 reps - Seated Hamstring Stretch  - 1 x daily - 7 x weekly - 2 reps - 20 sec hold - Seated Scapular Retraction  - 1 x daily - 7 x weekly - 2 sets - 10 reps - Shoulder External Rotation with Resistance  - 1 x daily - 7 x weekly - 2 sets - 10 reps  ASSESSMENT:  CLINICAL IMPRESSION: Morgan Fitzgerald presents with increased pain today in her right  ankles, knee and low back. Decreased exercise intensity some today to patient's high pain levels. She tolerated all exercises well today. She did not verbalize any pain or discomfort. PT monitored patient response throughout session and provided cues as needed. Patient will benefit from skilled PT to address the below impairments and improve overall function.      OBJECTIVE IMPAIRMENTS: Abnormal gait, decreased activity tolerance, decreased balance, decreased mobility, difficulty walking, decreased ROM, decreased strength, hypomobility, increased muscle spasms, impaired flexibility, impaired sensation, improper body mechanics, postural  dysfunction, and pain.   ACTIVITY LIMITATIONS: lifting, bending, standing, squatting, sleeping, stairs, transfers, bed mobility, continence, toileting, dressing, hygiene/grooming, and locomotion level  PARTICIPATION LIMITATIONS: meal prep, cleaning, laundry, interpersonal relationship, shopping, community activity, occupation, and yard work  PERSONAL FACTORS: Age, Fitness, Time since onset of injury/illness/exacerbation, and 1-2 comorbidities: HTN; Scoliosis are also affecting patient's functional outcome.   REHAB POTENTIAL: Good  CLINICAL DECISION MAKING: Evolving/moderate complexity  EVALUATION COMPLEXITY: Moderate   GOALS: Goals reviewed with patient? Yes  SHORT TERM GOALS: Target date: 01/24/2024  Patient will be independent with initial HEP. Baseline:  Goal status: met 02/21/2024  2.  Patient will be able participate in the 3 or 6 MWT to establish a baseline for the test. Baseline:  Goal status: Met on 01/04/2024 (see above)  3.  Patient will demonstrate correct hinging technique to prevent mechanical strain and pain during lifting and bending activities. Baseline:  Goal status: IN PROGRESS 02/21/2024    LONG TERM GOALS: Target date: 04/17/2024  Patient will demonstrate independence in advanced HEP. Baseline:  Goal status: IN PROGRESS 02/21/2024  2.  Patient will report > or = to 30% improvement in functional mobility since starting PT. Baseline:  Goal status: met (60%) 02/21/2024  3.  Patient will verbalize and demonstrate self-care strategies to manage pain including tissue mobility practices and change of position. Baseline:  Goal status: IN PROGRESS 02/21/2024  4.  Patient will score < or = to 12 sec on TUG to decrease falls risk. Baseline: 13.44 sec Goal status: MET 02/21/2024  5.  Patient will be able to walk for at least 6 mins to improve functional mobility and community ambulation. Baseline:  Goal status: IN PROGRESS (3 MINS ) 02/21/2024  5.  Patient will  be able to stand for at least 5-6 mins for improved performance of self care and ADLS. Baseline:  Goal status: NEW   PLAN:  PT FREQUENCY: 1-2x/week  PT DURATION: 8 weeks  PLANNED INTERVENTIONS: 97164- PT Re-evaluation, 97110-Therapeutic exercises, 97530- Therapeutic activity, 97112- Neuromuscular re-education, 97535- Self Care, [id number]- Manual therapy, [phone]- Gait training, [phone]- Canalith repositioning, [id number]- Aquatic Therapy, [phone]- Electrical stimulation (unattended), [phone]- Electrical stimulation (manual), [id number]- Vasopneumatic device, [id number]- Ultrasound, [id number]- Traction (mechanical), [id number]- Ionotophoresis 4mg /ml Dexamethasone , [id number] (1-2 muscles), [id number] (3+ muscles)- Dry Needling, Patient/Family education, Balance training, Stair training, Taping, Joint mobilization, Joint manipulation, Spinal manipulation, Spinal mobilization, Scar mobilization, Compression bandaging, Vestibular training, Cryotherapy, and Moist heat.  PLAN FOR NEXT SESSION: continue lower intensity exercises if needed; core strengthening; seated hinging; strengthening, flexibility   Kristeen Sar, PT, DPT 03/19/24 2:09 PM [facility] [address] [place], [place] [address] Phone # [phone] Fax [phone]

## 2024-03-26 ENCOUNTER — Ambulatory Visit: Admitting: Rehabilitative and Restorative Service Providers"

## 2024-03-28 ENCOUNTER — Ambulatory Visit: Admitting: Physical Therapy

## 2024-03-28 ENCOUNTER — Encounter: Payer: Self-pay | Admitting: Physical Therapy

## 2024-03-28 DIAGNOSIS — M5459 Other low back pain: Secondary | ICD-10-CM

## 2024-03-28 DIAGNOSIS — R293 Abnormal posture: Secondary | ICD-10-CM

## 2024-03-28 DIAGNOSIS — M6281 Muscle weakness (generalized): Secondary | ICD-10-CM

## 2024-03-28 DIAGNOSIS — R2689 Other abnormalities of gait and mobility: Secondary | ICD-10-CM

## 2024-03-28 NOTE — Therapy (Signed)
 OUTPATIENT PHYSICAL THERAPY TREATMENT NOTE  Patient Name: Morgan Fitzgerald MRN: [id number] DOB:01-16-1946, 78 y.o., female Today's Date: 03/28/2024  END OF SESSION:  PT End of Session - 03/28/24 1506     Visit Number 7    Date for Recertification  04/17/24    Authorization Type [employer] MCR    Progress Note Due on Visit 10    PT Start Time 1237    PT Stop Time 1314    PT Time Calculation (min) 37 min    Activity Tolerance Patient tolerated treatment well    Behavior During Therapy [facility] for tasks assessed/performed               Past Medical History:  Diagnosis Date   Allergic state 01/14/2011   Bronchitis 03/24/2011   Chicken pox as a child   Hyperlipidemia    Hyperlipidemia 03/09/2012   Hypertension    Measles as a child   Preventative health care 01/14/2011   Tobacco abuse 03/24/2011   Past Surgical History:  Procedure Laterality Date   BREAST SURGERY  1997   breast biopsy, right   carpal tunnel release, b/l  1990's   right foot bunionectomy  2010   4 pins in lateral right foot   right shoulder repai  1966   s/p fracture that healed poorly   TONSILLECTOMY AND ADENOIDECTOMY     total knee replacement, right     TUBAL LIGATION     Patient Active Problem List   Diagnosis Date Noted   Degeneration of lumbar intervertebral disc 12/01/2017   Scoliosis deformity of spine 12/01/2017   Tibialis posterior tendinitis 07/27/2017   Tobacco abuse 03/24/2011   Bronchitis 03/24/2011   Preventative health care 01/14/2011   Allergy 01/14/2011   Chicken pox    Measles    Viral warts 12/10/2009   Mixed hyperlipidemia 12/10/2009   Overweight 12/10/2009   ANEMIA 12/10/2009   ESSENTIAL HYPERTENSION, BENIGN 12/10/2009   RENAL INSUFFICIENCY, CHRONIC 12/10/2009   KNEE PAIN 12/10/2009   NECK PAIN, CHRONIC 12/10/2009   Spina bifida with hydrocephalus (HCC) 12/10/2009    PCP: Cleotilde Planas, MD   REFERRING PROVIDER: Gretel Piedra, NP  REFERRING DIAG:  Diagnosis  M51.360  (ICD-10-CM) - Other intervertebral disc degeneration, lumbar region with discogenic back pain only    Rationale for Evaluation and Treatment: Rehabilitation  THERAPY DIAG:  Other low back pain  Abnormal posture  Muscle weakness (generalized)  Other abnormalities of gait and mobility  ONSET DATE: Chronic  SUBJECTIVE:  SUBJECTIVE STATEMENT: Patient reports she is hurting today. Pain is 6/10. She felt good after last session.  PERTINENT HISTORY:  Hx right TKA; HTN; Scoliosis; Hx right ankle fusion  PAIN:  Are you having pain? Yes: NPRS scale: 7/10 Pain location: Lower back both sides Pain description: constant dull; achy Aggravating factors: getting out of bed; walking & standing; bending Relieving factors: resting (not doing any yard or cleaning)  PRECAUTIONS: None  RED FLAGS: None   WEIGHT BEARING RESTRICTIONS: No  FALLS:  Has patient fallen in last 6 months? 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  DIAGNOSTIC FINDINGS:  02/2018 Lumbar  X ray IMPRESSION: 1. Dextroconvex lumbar scoliosis with diffuse advanced lumbar disc and endplate degeneration. Widespread posterior element hypertrophy. Lower thoracic disc degeneration. 2. Degenerative edema in the L1 and L2 vertebral bodies. No other No acute osseous abnormality identified. 3. Moderate to severe multifactorial spinal stenosis at L1-L2 which is the level of the conus medullaris. No conus or lower thoracic spinal cord signal abnormality. 4. Mild spinal stenosis at L1-L2 and T11-T12. 5. Moderate to severe neural foraminal stenosis at the left T11, left L2, right L3, right L4, and bilateral L5 nerve levels.  PATIENT SURVEYS:  ODI: 17/50 34%  Minimally Clinically Important Difference (MCID) = 12.8%  11/4 ODI: 15/50 30%  COGNITION: Overall cognitive status: Within functional limits for tasks assessed     SENSATION: Numbness in right leg that goes down to her feet    POSTURE: rounded shoulders, forward head, and Lateral trunk lean to the right   PALPATION: Lumbar: Concave to the right ;convex to the left lumbar Left pelvis elevated  LUMBAR ROM: Not assessed at eval  AROM eval  Flexion   Extension   Right lateral flexion   Left lateral flexion   Right rotation   Left rotation    (Blank rows = not tested)  LOWER EXTREMITY ROM:   WFL bilateral    LOWER EXTREMITY MMT:    MMT Right eval Left eval  Hip flexion 4- 4-  Hip extension    Hip abduction 4 4-  Hip adduction    Hip internal rotation    Hip external rotation    Knee flexion 4+ 4+  Knee extension 4+ 4-  Ankle dorsiflexion    Ankle plantarflexion    Ankle inversion    Ankle eversion     (Blank rows = not tested)   FUNCTIONAL TESTS:  Eval: 5 times sit to stand: 9.69 sec no UE support ; uncontrolled descent Timed up and go (TUG): 13.44 sec   01/04/2024: 3 minute walk test:  285 ft with pain increasing to 8/10, RPE of 8/10 02/21/2024 TUG:10.39 sec 3MWT:404 ft RPE 8/10 (patient  felt like her back was getting weak)  GAIT:  Comments: right lateral  trunk lean, Wide BOS   TREATMENT DATE:  03/28/2024 NuStep Level 4 7 mins- PT present to discuss status Seated hamstring stretch 2 x 20 sec bilateral  Seated figure four  2 x 20 sec bilateral  3 way stability ball stretch x 10 each direction Seated hip to hip 4# DB 2 x 10 Sit to stand holding 4# DB x 10 Standing chest press + TA activation with 4# DB 2 x 10 Standing shoulder row with red TB 2 x 10  Seated shoulder abduction with red TB 2 x 10  Seated shoulder ER with red TB 2 x 10 Seated TA activation with hip abduction red loop 2 x 10 Seated TA activation ball press 2 x 10   03/19/2024: Patient was late to appointment NuStep Level 4 7 mins- PT present to discuss status Seated hamstring stretch 2 x 20 sec bilateral  Seated figure four with bottom leg straight 2 x 20 sec bilateral  3 way stability ball stretch x 10 each direction Seated hip to hip 4# DB 2 x 10 Seated chest press + TA activation with 4# DB 2 x 10   02/28/2024: NuStep Level 4 6 mins- PT present to discuss status QL stretch with peanut ball 3 x 10 sec bilateral  Seated hamstring stretch 2 x 20 sec bilateral  Seated TA activation ball press 2 x 10 Seated TA activaiton + hip adduction 2 x 10 Seated TA activation + hip abduction with red loop Seated ear to hip 4# x 8 each direction Sit to stand holding 4# DB x 12    02/21/2024: : 404 ft RPE 8/10 (patient felt like her back was getting weak) ODI: 15/50 30% TUG 10.39sec Seated ear to hip 5# x 10 each direction BP: 156/72 HR 63 Seated chest press 5# 2 x 12 Standing lateral trunk flexion to the left holding 5 # DB x 12 Pallof press with red TB x 12 each direction Seated alt hand and knee press with small ball x 10 bilateral  Standing shoulder row with red TB 2 x 10      PATIENT EDUCATION:  Education details: PT eval findings, anticipated POC, progress with PT, and initial  HEP Person educated: Patient Education method: Explanation, Demonstration, and Handouts Education comprehension: verbalized understanding, returned demonstration, and needs further education  HOME EXERCISE PROGRAM: Access Code: [email or web address] URL: [email or web address] Date: 01/27/2024 Prepared by: Kristeen Sar  Exercises - Sidelying Diaphragmatic Breathing  - 1 x daily - 7 x weekly - 1 sets - 5 reps - 3 hold - Seated Alternating Side Stretch with Arm Overhead  - 1 x daily - 7 x weekly - 3 sets - 10s hold - Supine Lower Trunk Rotation  - 1 x daily - 7 x weekly - 1 sets - 8 reps - 4s hold - Seated Transversus Abdominis Bracing  - 1 x daily - 7 x weekly - 1-2 sets - 10 reps - Seated Hamstring Stretch  - 1 x daily - 7 x weekly - 2 reps - 20 sec hold - Seated Scapular Retraction  - 1 x daily - 7 x weekly - 2 sets - 10 reps - Shoulder External Rotation with Resistance  - 1 x daily - 7 x weekly - 2 sets - 10 reps  ASSESSMENT:  CLINICAL IMPRESSION: Marval continues to verbalize increased back pain. She felt really good after last treatment session. Today we continued the same intensity as last session with the addition of postural strengthening. With the  addition of standing exercises patient verbalized increased achness in her back. PT monitored patient response throughout and provided visual and verbal cues as needed. Patient will benefit from skilled PT to address the below impairments and improve overall function.      OBJECTIVE IMPAIRMENTS: Abnormal gait, decreased activity tolerance, decreased balance, decreased mobility, difficulty walking, decreased ROM, decreased strength, hypomobility, increased muscle spasms, impaired flexibility, impaired sensation, improper body mechanics, postural dysfunction, and pain.   ACTIVITY LIMITATIONS: lifting, bending, standing, squatting, sleeping, stairs, transfers, bed mobility, continence, toileting, dressing, hygiene/grooming, and locomotion  level  PARTICIPATION LIMITATIONS: meal prep, cleaning, laundry, interpersonal relationship, shopping, community activity, occupation, and yard work  PERSONAL FACTORS: Age, Fitness, Time since onset of injury/illness/exacerbation, and 1-2 comorbidities: HTN; Scoliosis are also affecting patient's functional outcome.   REHAB POTENTIAL: Good  CLINICAL DECISION MAKING: Evolving/moderate complexity  EVALUATION COMPLEXITY: Moderate   GOALS: Goals reviewed with patient? Yes  SHORT TERM GOALS: Target date: 01/24/2024  Patient will be independent with initial HEP. Baseline:  Goal status: met 02/21/2024  2.  Patient will be able participate in the 3 or 6 MWT to establish a baseline for the test. Baseline:  Goal status: Met on 01/04/2024 (see above)  3.  Patient will demonstrate correct hinging technique to prevent mechanical strain and pain during lifting and bending activities. Baseline:  Goal status: IN PROGRESS 02/21/2024    LONG TERM GOALS: Target date: 04/17/2024  Patient will demonstrate independence in advanced HEP. Baseline:  Goal status: IN PROGRESS 02/21/2024  2.  Patient will report > or = to 30% improvement in functional mobility since starting PT. Baseline:  Goal status: met (60%) 02/21/2024  3.  Patient will verbalize and demonstrate self-care strategies to manage pain including tissue mobility practices and change of position. Baseline:  Goal status: IN PROGRESS 02/21/2024  4.  Patient will score < or = to 12 sec on TUG to decrease falls risk. Baseline: 13.44 sec Goal status: MET 02/21/2024  5.  Patient will be able to walk for at least 6 mins to improve functional mobility and community ambulation. Baseline:  Goal status: IN PROGRESS (3 MINS ) 02/21/2024  5.  Patient will be able to stand for at least 5-6 mins for improved performance of self care and ADLS. Baseline:  Goal status: NEW   PLAN:  PT FREQUENCY: 1-2x/week  PT DURATION: 8 weeks  PLANNED  INTERVENTIONS: [id number]- PT Re-evaluation, 97110-Therapeutic exercises, 97530- Therapeutic activity, [id number]- Neuromuscular re-education, 97535- Self Care, [id number]- Manual therapy, [phone]- Gait training, [phone]- Canalith repositioning, [id number]- Aquatic Therapy, [phone]- Electrical stimulation (unattended), [phone]- Electrical stimulation (manual), [id number]- Vasopneumatic device, [id number]- Ultrasound, [id number]- Traction (mechanical), [id number]- Ionotophoresis 4mg /ml Dexamethasone , [id number] (1-2 muscles), [id number] (3+ muscles)- Dry Needling, Patient/Family education, Balance training, Stair training, Taping, Joint mobilization, Joint manipulation, Spinal manipulation, Spinal mobilization, Scar mobilization, Compression bandaging, Vestibular training, Cryotherapy, and Moist heat.  PLAN FOR NEXT SESSION: recert to include remaining visits;continue lower intensity exercises if needed; core strengthening; seated hinging; strengthening, flexibility   Kristeen Sar, PT, DPT 03/28/24 3:08 PM [facility] [address] [place], [place] [address] Phone # [phone] Fax [phone]

## 2024-04-02 ENCOUNTER — Ambulatory Visit: Admitting: Physical Therapy

## 2024-04-16 ENCOUNTER — Encounter: Payer: Self-pay | Admitting: Rehabilitative and Restorative Service Providers"

## 2024-04-16 ENCOUNTER — Ambulatory Visit: Admitting: Rehabilitative and Restorative Service Providers"

## 2024-04-16 DIAGNOSIS — M6281 Muscle weakness (generalized): Secondary | ICD-10-CM

## 2024-04-16 DIAGNOSIS — R293 Abnormal posture: Secondary | ICD-10-CM

## 2024-04-16 DIAGNOSIS — M5459 Other low back pain: Secondary | ICD-10-CM | POA: Diagnosis not present

## 2024-04-16 DIAGNOSIS — R2689 Other abnormalities of gait and mobility: Secondary | ICD-10-CM

## 2024-04-16 NOTE — Therapy (Signed)
 " OUTPATIENT PHYSICAL THERAPY TREATMENT NOTE AND REASSESSMENT NOTE  Patient Name: Johnnye Sandford MRN: [id number] DOB:1945/05/20, 78 y.o., female Today's Date: 04/16/2024   Progress Note Reporting Period 02/21/2024 to 04/16/2024  See note below for Objective Data and Assessment of Progress/Goals.       END OF SESSION:  PT End of Session - 04/16/24 1153     Visit Number 8    Date for Recertification  06/08/24    Authorization Type [employer] M[employer]    Progress Note Due on Visit 18    PT Start Time 1151    PT Stop Time 1230    PT Time Calculation (min) 39 min    Activity Tolerance Patient tolerated treatment well    Behavior During Therapy WFL for tasks assessed/performed               Past Medical History:  Diagnosis Date   Allergic state 01/14/2011   Bronchitis 03/24/2011   Chicken pox as a child   Hyperlipidemia    Hyperlipidemia 03/09/2012   Hypertension    Measles as a child   Preventative health care 01/14/2011   Tobacco abuse 03/24/2011   Past Surgical History:  Procedure Laterality Date   BREAST SURGERY  1997   breast biopsy, right   carpal tunnel release, b/l  1990's   right foot bunionectomy  2010   4 pins in lateral right foot   right shoulder repai  1966   s/p fracture that healed poorly   TONSILLECTOMY AND ADENOIDECTOMY     total knee replacement, right     TUBAL LIGATION     Patient Active Problem List   Diagnosis Date Noted   Degeneration of lumbar intervertebral disc 12/01/2017   Scoliosis deformity of spine 12/01/2017   Tibialis posterior tendinitis 07/27/2017   Tobacco abuse 03/24/2011   Bronchitis 03/24/2011   Preventative health care 01/14/2011   Allergy 01/14/2011   Chicken pox    Measles    Viral warts 12/10/2009   Mixed hyperlipidemia 12/10/2009   Overweight 12/10/2009   ANEMIA 12/10/2009   ESSENTIAL HYPERTENSION, BENIGN 12/10/2009   RENAL INSUFFICIENCY, CHRONIC 12/10/2009   KNEE PAIN 12/10/2009   NECK PAIN, CHRONIC 12/10/2009    Spina bifida with hydrocephalus (HCC) 12/10/2009    PCP: Cleotilde Planas, MD   REFERRING PROVIDER: Gretel Piedra, NP  REFERRING DIAG:  Diagnosis  M51.360 (ICD-10-CM) - Other intervertebral disc degeneration, lumbar region with discogenic back pain only    Rationale for Evaluation and Treatment: Rehabilitation  THERAPY DIAG:  Other low back pain - Plan: PT plan of care cert/re-cert  Abnormal posture - Plan: PT plan of care cert/re-cert  Muscle weakness (generalized) - Plan: PT plan of care cert/re-cert  Other abnormalities of gait and mobility - Plan: PT plan of care cert/re-cert  ONSET DATE: Chronic  SUBJECTIVE:  SUBJECTIVE STATEMENT: Patient reports that her main complaint is decreased endurance.  She states that she cannot stand for more than a couple of minutes before she needs to sit.  PERTINENT HISTORY:  Hx right TKA; HTN; Scoliosis; Hx right ankle fusion  PAIN:  Are you having pain? Yes: NPRS scale: 5/10 Pain location: Lower back both sides Pain description: constant dull; achy Aggravating factors: getting out of bed; walking & standing; bending Relieving factors: resting (not doing any yard or cleaning)  PRECAUTIONS: None  RED FLAGS: None   WEIGHT BEARING RESTRICTIONS: No  FALLS:  Has patient fallen in last 6 months? 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  DIAGNOSTIC FINDINGS:  02/2018 Lumbar X ray IMPRESSION: 1. Dextroconvex lumbar scoliosis with diffuse advanced lumbar disc and endplate degeneration. Widespread posterior element hypertrophy. Lower thoracic disc degeneration. 2. Degenerative edema in the L1 and L2 vertebral bodies. No other No acute osseous abnormality identified. 3. Moderate to severe multifactorial spinal stenosis at L1-L2 which is the level of the conus medullaris. No conus or lower thoracic spinal cord signal abnormality. 4. Mild spinal stenosis at L1-L2 and T11-T12. 5. Moderate to severe neural foraminal stenosis at the left T11, left L2, right L3, right L4, and bilateral L5 nerve levels.  PATIENT SURVEYS:  Eval:  ODI: 17/50 34%  11/4 ODI: 15/50 30%  04/16/2024:  Modified Oswestry Disability Index 19/50 = 38.0%  Minimally Clinically Important Difference (MCID) = 12.8%    COGNITION: Overall cognitive status: Within functional limits for tasks assessed     SENSATION: Numbness in right leg that goes down to her feet    POSTURE: rounded shoulders, forward head, and Lateral trunk lean to the right   PALPATION: Lumbar: Concave to the right ;convex to the left lumbar Left pelvis elevated   LOWER EXTREMITY ROM:   WFL bilateral    LOWER EXTREMITY MMT:    MMT Right eval Left eval  Hip flexion 4- 4-  Hip extension    Hip abduction 4 4-  Hip adduction    Hip internal rotation    Hip external rotation    Knee flexion 4+ 4+  Knee extension 4+ 4-  Ankle dorsiflexion    Ankle plantarflexion    Ankle inversion    Ankle eversion     (Blank rows = not  tested)   FUNCTIONAL TESTS:  Eval: 5 times sit to stand: 9.69 sec no UE support ; uncontrolled descent Timed up and go (TUG): 13.44 sec   01/04/2024: 3 minute walk test:  285 ft with pain increasing to 8/10, RPE of 8/10  02/21/2024 TUG:10.39 sec 3MWT:404 ft RPE 8/10 (patient felt like her back was getting weak)   04/16/2024: Timed up and go (TUG):  12.55 sec 5 times sit to stand:  9.44  sec 6 minute walk test:  704 ft with 2 seated recovery periods with pain increasing to 8/10  GAIT:  Comments: right lateral trunk lean, Wide BOS   TREATMENT DATE:   04/16/2024 NuStep Level 4 x 4 min- PT present to discuss status 5 times sit to/from stand, TUG 6 minute walk test:  704 ft with 2 seated recovery periods with pain increasing to 8/10 Modified Oswestry 38% Seated hamstring stretch 2 x 20 sec bilateral  Seated figure four  2 x 20 sec bilateral  3 way stability ball stretch x 10 each direction Seated core series with 4# dumbbell:  hip to hip, hip to alt shoulder.  X10 each bilat Sit to/from stand holding 4# dumbbell x10   03/28/2024 NuStep Level 4 7 mins- PT present to discuss status Seated hamstring stretch 2 x 20 sec bilateral  Seated figure four  2 x 20 sec bilateral  3 way stability ball stretch x 10 each direction Seated hip to hip 4# DB 2 x 10 Sit to stand holding 4# DB x 10 Standing chest press + TA activation with 4# DB 2 x 10 Standing shoulder row with red TB 2 x 10  Seated shoulder abduction with red TB 2 x 10  Seated shoulder ER with red TB 2 x 10 Seated TA activation with hip abduction red loop 2 x 10 Seated TA activation ball press 2 x 10   03/19/2024: Patient was late to appointment NuStep Level 4 7 mins- PT present to discuss status Seated hamstring stretch 2 x 20 sec bilateral  Seated figure four with bottom leg straight 2 x 20 sec bilateral  3 way stability ball stretch x 10 each direction Seated hip to hip 4# DB 2 x 10 Seated chest press + TA  activation with 4# DB 2 x 10     PATIENT EDUCATION:  Education details: PT eval findings, anticipated POC, progress with PT, and initial HEP Person educated: Patient Education method: Explanation, Demonstration, and Handouts Education comprehension: verbalized understanding, returned demonstration, and needs further education  HOME EXERCISE PROGRAM: Access Code: [email or web address] URL: [email or web address] Date: 01/27/2024 Prepared by: Kristeen Sar  Exercises - Sidelying Diaphragmatic Breathing  - 1 x daily - 7 x weekly - 1 sets - 5 reps - 3 hold - Seated Alternating Side Stretch with Arm Overhead  - 1 x daily - 7 x weekly - 3 sets - 10s hold - Supine Lower Trunk Rotation  - 1 x daily - 7 x weekly - 1 sets - 8 reps - 4s hold - Seated Transversus Abdominis Bracing  - 1 x daily - 7 x weekly - 1-2 sets - 10 reps - Seated Hamstring Stretch  - 1 x daily - 7 x weekly - 2 reps - 20 sec hold - Seated Scapular Retraction  - 1 x daily - 7 x weekly - 2 sets - 10 reps - Shoulder External Rotation with Resistance  - 1 x daily - 7 x weekly - 2 sets - 10 reps  ASSESSMENT:  CLINICAL IMPRESSION:  Ms Giroux presents to skilled PT reporting that she is doing better, but still has increased pain with standing and walking.  Patient continues to report that she is limited with ambulation tolerance and wants to continue with PT.  Patient able to perform a 6 minute walk test today, but did require 2 seated recovery periods and had increased pain noted.  Patient continues with similar score on Modified Oswestry Disability Index.  Patient with  slight improvement with 5 times sit to stand, but did have improved quality of movement noted.  Patient continues to have great motivation throughout, despite the increased pain.  Patient continues to progress with activity tolerance and strengthening.  Patient is making progress towards goals, but would benefit from continued skilled PT of 1-2x/week for an additional 8  weeks.      OBJECTIVE IMPAIRMENTS: Abnormal gait, decreased activity tolerance, decreased balance, decreased mobility, difficulty walking, decreased ROM, decreased strength, hypomobility, increased muscle spasms, impaired flexibility, impaired sensation, improper body mechanics, postural dysfunction, and pain.   ACTIVITY LIMITATIONS: lifting, bending, standing, squatting, sleeping, stairs, transfers, bed mobility, continence, toileting, dressing, hygiene/grooming, and locomotion level  PARTICIPATION LIMITATIONS: meal prep, cleaning, laundry, interpersonal relationship, shopping, community activity, occupation, and yard work  PERSONAL FACTORS: Age, Fitness, Time since onset of injury/illness/exacerbation, and 1-2 comorbidities: HTN; Scoliosis are also affecting patient's functional outcome.   REHAB POTENTIAL: Good  CLINICAL DECISION MAKING: Evolving/moderate complexity  EVALUATION COMPLEXITY: Moderate   GOALS: Goals reviewed with patient? Yes  SHORT TERM GOALS: Target date: 01/24/2024  Patient will be independent with initial HEP. Baseline:  Goal status: met 02/21/2024  2.  Patient will be able participate in the 3 or 6 MWT to establish a baseline for the test. Baseline:  Goal status: Met on 01/04/2024 (see above)  3.  Patient will demonstrate correct hinging technique to prevent mechanical strain and pain during lifting and bending activities. Baseline:  Goal status: Met on 04/16/24    LONG TERM GOALS: Target date: 06/08/2024  Patient will demonstrate independence in advanced HEP. Baseline:  Goal status: IN PROGRESS 02/21/2024  2.  Patient will report > or = to 30% improvement in functional mobility since starting PT. Baseline:  Goal status: met (60%) 02/21/2024  3.  Patient will verbalize and demonstrate self-care strategies to manage pain including tissue mobility practices and change of position. Baseline:  Goal status: IN PROGRESS 02/21/2024  4.  Patient will score <  or = to 12 sec on TUG to decrease falls risk. Baseline: 13.44 sec Goal status: MET 02/21/2024  5.  Patient will be able to walk for at least 6 mins to improve functional mobility and community ambulation. Baseline:  Goal status: Ongoing (see above)  5.  Patient will be able to stand for at least 5-6 mins for improved performance of self care and ADLS. Baseline:  Goal status: NEW   PLAN:  PT FREQUENCY: 1-2x/week  PT DURATION: 8 weeks  PLANNED INTERVENTIONS: 97164- PT Re-evaluation, 97110-Therapeutic exercises, 97530- Therapeutic activity, [id number]- Neuromuscular re-education, 97535- Self Care, [id number]- Manual therapy, [phone]- Gait training, [phone]- Canalith repositioning, [id number]- Aquatic Therapy, [phone]- Electrical stimulation (unattended), [phone]- Electrical stimulation (manual), [id number]- Vasopneumatic device, [id number]- Ultrasound, [id number]- Traction (mechanical), [id number]- Ionotophoresis 4mg /ml Dexamethasone , [id number] (1-2 muscles), [id number] (3+ muscles)- Dry Needling, Patient/Family education, Balance training, Stair training, Taping, Joint mobilization, Joint manipulation, Spinal manipulation, Spinal mobilization, Scar mobilization, Compression bandaging, Vestibular training, Cryotherapy, and Moist heat.  PLAN FOR NEXT SESSION: Activity tolerance; core strengthening; seated hinging; strengthening, flexibility   Jarrell Laming, PT, DPT 04/16/2024, 1:32 PM  [facility] [address], [address] [place], [place] [address] Phone # [phone] Fax [phone]  "

## 2024-04-23 ENCOUNTER — Ambulatory Visit: Admitting: Physical Therapy

## 2024-04-30 ENCOUNTER — Telehealth: Payer: Self-pay | Admitting: Physical Therapy

## 2024-04-30 ENCOUNTER — Ambulatory Visit: Attending: Nurse Practitioner | Admitting: Physical Therapy

## 2024-04-30 DIAGNOSIS — M5459 Other low back pain: Secondary | ICD-10-CM | POA: Insufficient documentation

## 2024-04-30 DIAGNOSIS — M6281 Muscle weakness (generalized): Secondary | ICD-10-CM | POA: Insufficient documentation

## 2024-04-30 DIAGNOSIS — R2689 Other abnormalities of gait and mobility: Secondary | ICD-10-CM | POA: Insufficient documentation

## 2024-04-30 DIAGNOSIS — R293 Abnormal posture: Secondary | ICD-10-CM | POA: Insufficient documentation

## 2024-04-30 NOTE — Telephone Encounter (Signed)
 Called patient about missed appointment. She responded no to the text message and thought it cancelled her appointment. She confirmed appointment for 1/20.   Kristeen Sar, PT, DPT 04/30/2024 12:56 PM

## 2024-05-03 ENCOUNTER — Other Ambulatory Visit: Payer: Self-pay | Admitting: *Deleted

## 2024-05-03 DIAGNOSIS — R1084 Generalized abdominal pain: Secondary | ICD-10-CM

## 2024-05-08 ENCOUNTER — Encounter: Payer: Self-pay | Admitting: Physical Therapy

## 2024-05-08 ENCOUNTER — Ambulatory Visit: Admitting: Physical Therapy

## 2024-05-08 DIAGNOSIS — M6281 Muscle weakness (generalized): Secondary | ICD-10-CM

## 2024-05-08 DIAGNOSIS — R2689 Other abnormalities of gait and mobility: Secondary | ICD-10-CM

## 2024-05-08 DIAGNOSIS — R293 Abnormal posture: Secondary | ICD-10-CM

## 2024-05-08 DIAGNOSIS — M5459 Other low back pain: Secondary | ICD-10-CM | POA: Diagnosis present

## 2024-05-08 NOTE — Therapy (Signed)
 " OUTPATIENT PHYSICAL THERAPY TREATMENT NOTE   Patient Name: Morgan Fitzgerald MRN: [id number] DOB:Jun 29, 1945, 79 y.o., female Today's Date: 05/08/2024      END OF SESSION:  PT End of Session - 05/08/24 1416     Visit Number 9    Date for Recertification  06/08/24    Authorization Type BCBS MCR    Progress Note Due on Visit 18    PT Start Time 1239    PT Stop Time 1321    PT Time Calculation (min) 42 min    Activity Tolerance Patient tolerated treatment well    Behavior During Therapy [facility] for tasks assessed/performed                Past Medical History:  Diagnosis Date   Allergic state 01/14/2011   Bronchitis 03/24/2011   Chicken pox as a child   Hyperlipidemia    Hyperlipidemia 03/09/2012   Hypertension    Measles as a child   Preventative health care 01/14/2011   Tobacco abuse 03/24/2011   Past Surgical History:  Procedure Laterality Date   BREAST SURGERY  1997   breast biopsy, right   carpal tunnel release, b/l  1990's   right foot bunionectomy  2010   4 pins in lateral right foot   right shoulder repai  1966   s/p fracture that healed poorly   TONSILLECTOMY AND ADENOIDECTOMY     total knee replacement, right     TUBAL LIGATION     Patient Active Problem List   Diagnosis Date Noted   Degeneration of lumbar intervertebral disc 12/01/2017   Scoliosis deformity of spine 12/01/2017   Tibialis posterior tendinitis 07/27/2017   Tobacco abuse 03/24/2011   Bronchitis 03/24/2011   Preventative health care 01/14/2011   Allergy 01/14/2011   Chicken pox    Measles    Viral warts 12/10/2009   Mixed hyperlipidemia 12/10/2009   Overweight 12/10/2009   ANEMIA 12/10/2009   ESSENTIAL HYPERTENSION, BENIGN 12/10/2009   RENAL INSUFFICIENCY, CHRONIC 12/10/2009   KNEE PAIN 12/10/2009   NECK PAIN, CHRONIC 12/10/2009   Spina bifida with hydrocephalus (HCC) 12/10/2009    PCP: Cleotilde Planas, MD   REFERRING PROVIDER: Gretel Piedra, NP  REFERRING DIAG:  Diagnosis   M51.360 (ICD-10-CM) - Other intervertebral disc degeneration, lumbar region with discogenic back pain only    Rationale for Evaluation and Treatment: Rehabilitation  THERAPY DIAG:  Other low back pain  Abnormal posture  Muscle weakness (generalized)  Other abnormalities of gait and mobility  ONSET DATE: Chronic  SUBJECTIVE:  SUBJECTIVE STATEMENT: Patient reports she is doing okay today. Left knee is causing her increased pain more than her back.   PERTINENT HISTORY:  Hx right TKA; HTN; Scoliosis; Hx right ankle fusion  PAIN:  Are you having pain? Yes: NPRS scale: 5/10 Pain location: Lower back both sides Pain description: constant dull; achy Aggravating factors: getting out of bed; walking & standing; bending Relieving factors: resting (not doing any yard or cleaning)  PRECAUTIONS: None  RED FLAGS: None   WEIGHT BEARING RESTRICTIONS: No  FALLS:  Has patient fallen in last 6 months? 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  DIAGNOSTIC  FINDINGS:  02/2018 Lumbar X ray IMPRESSION: 1. Dextroconvex lumbar scoliosis with diffuse advanced lumbar disc and endplate degeneration. Widespread posterior element hypertrophy. Lower thoracic disc degeneration. 2. Degenerative edema in the L1 and L2 vertebral bodies. No other No acute osseous abnormality identified. 3. Moderate to severe multifactorial spinal stenosis at L1-L2 which is the level of the conus medullaris. No conus or lower thoracic spinal cord signal abnormality. 4. Mild spinal stenosis at L1-L2 and T11-T12. 5. Moderate to severe neural foraminal stenosis at the left T11, left L2, right L3, right L4, and bilateral L5 nerve levels.  PATIENT SURVEYS:  Eval:  ODI: 17/50 34%  11/4 ODI: 15/50 30%  04/16/2024:  Modified Oswestry Disability Index 19/50 = 38.0%  Minimally Clinically Important Difference (MCID) = 12.8%    COGNITION: Overall cognitive status: Within functional limits for tasks assessed     SENSATION: Numbness in right leg that goes down to her feet    POSTURE: rounded shoulders, forward head, and Lateral trunk lean to the right   PALPATION: Lumbar: Concave to the right ;convex to the left lumbar Left pelvis elevated   LOWER EXTREMITY ROM:   WFL bilateral    LOWER EXTREMITY MMT:    MMT Right eval Left eval  Hip flexion 4- 4-  Hip extension    Hip abduction 4 4-  Hip adduction    Hip internal rotation    Hip external rotation    Knee flexion 4+ 4+  Knee extension 4+ 4-  Ankle dorsiflexion    Ankle plantarflexion    Ankle inversion    Ankle eversion     (Blank rows = not tested)   FUNCTIONAL TESTS:  Eval: 5 times sit to stand: 9.69 sec no UE support ; uncontrolled descent Timed up and go (TUG): 13.44 sec   01/04/2024: 3 minute walk test:  285 ft with pain increasing to 8/10, RPE of 8/10  02/21/2024 TUG:10.39 sec 3MWT:404 ft RPE 8/10 (patient felt like her back was getting weak)   04/16/2024: Timed up and go (TUG):   12.55 sec 5 times sit to stand:  9.44 sec 6 minute walk test:  704 ft with  2 seated recovery periods with pain increasing to 8/10  GAIT:  Comments: right lateral trunk lean, Wide BOS   TREATMENT DATE:  05/08/2024 NuStep Level 5 x 5 min- PT present to discuss status Seated hamstring stretch 2 x 30 sec bilateral  Seated figure four  2 x 30 sec bilateral  Standing shoulder row with red TB 2 x 10  Standing shoulder extension  with red TB 2 x 10  Seated TA activation + chest press 2 x 10 Seated ear to hip 4# DB x 10 bilateral  6in step tap with 4# DB hold x 20 Seated TA activation with hip abduction red loop 2 x 10 Seated clam with black loop x 20 Seated march with black loop x 20 Seated LAQ X 30 b 0# Review and update of HEP Patient verbalized feeling dizzy after walking to get water. BP 168/79 HR 63. This is higher than patient's normal. She felt better after sitting.    04/16/2024 NuStep Level 4 x 4 min- PT present to discuss status 5 times sit to/from stand, TUG 6 minute walk test:  704 ft with 2 seated recovery periods with pain increasing to 8/10 Modified Oswestry 38% Seated hamstring stretch 2 x 20 sec bilateral  Seated figure four  2 x 20 sec bilateral  3 way stability ball stretch x 10 each direction Seated core series with 4# dumbbell:  hip to hip, hip to alt shoulder.  X10 each bilat Sit to/from stand holding 4# dumbbell x10   03/28/2024 NuStep Level 4 7 mins- PT present to discuss status Seated hamstring stretch 2 x 20 sec bilateral  Seated figure four  2 x 20 sec bilateral  3 way stability ball stretch x 10 each direction Seated hip to hip 4# DB 2 x 10 Sit to stand holding 4# DB x 10 Standing chest press + TA activation with 4# DB 2 x 10 Standing shoulder row with red TB 2 x 10  Seated shoulder abduction with red TB 2 x 10  Seated shoulder ER with red TB 2 x 10 Seated TA activation with hip abduction red loop 2 x 10 Seated TA activation ball press 2 x  10    PATIENT EDUCATION:  Education details: PT eval findings, anticipated POC, progress with PT, and initial HEP Person educated: Patient Education method: Explanation, Demonstration, and Handouts Education comprehension: verbalized understanding, returned demonstration, and needs further education  HOME EXERCISE PROGRAM: Access Code: [email or web address] URL: [email or web address] Date: 05/08/2024 Prepared by: Kristeen Sar  Exercises - Sidelying Diaphragmatic Breathing  - 1 x daily - 7 x weekly - 1 sets - 5 reps - 3 hold - Seated Alternating Side Stretch with Arm Overhead  - 1 x daily - 7 x weekly - 3 sets - 10s hold - Supine Lower Trunk Rotation  - 1 x daily - 7 x weekly - 1 sets - 8 reps - 4s hold - Seated Transversus Abdominis Bracing  - 1 x daily - 7 x weekly - 1-2 sets - 10 reps - Seated Hamstring Stretch  - 1 x daily - 7 x weekly - 2 reps - 20 sec hold - Seated Scapular Retraction  - 1 x daily - 7 x weekly - 2 sets - 10 reps - Shoulder External Rotation with Resistance  - 1 x daily - 7 x weekly - 2 sets - 10 reps - Standing Shoulder Row with Anchored Resistance  - 1 x daily - 7 x weekly - 2 sets -  10 reps - Shoulder extension with resistance - Neutral  - 1 x daily - 7 x weekly - 2 sets - 10 reps - Standing Shoulder Horizontal Abduction with Resistance  - 1 x daily - 7 x weekly - 2 sets - 10 reps  ASSESSMENT:  CLINICAL IMPRESSION: Ms. Zagami presents with increased left knee pain today. It has been a little stressful at home carrying for her husband. Today we focused on core and postural strengthening. PT provided verbal and visual cues as needed especially for breathing technique while performing exercises. Review and updated HEP exercises. She had some dizziness after the session, and her BP was a little higher than normal. After a seated rest and water patient felt better. Right now she is possibly going to have vascular surgery on her arteries so plan to place patient on a  30 day hold.      OBJECTIVE IMPAIRMENTS: Abnormal gait, decreased activity tolerance, decreased balance, decreased mobility, difficulty walking, decreased ROM, decreased strength, hypomobility, increased muscle spasms, impaired flexibility, impaired sensation, improper body mechanics, postural dysfunction, and pain.   ACTIVITY LIMITATIONS: lifting, bending, standing, squatting, sleeping, stairs, transfers, bed mobility, continence, toileting, dressing, hygiene/grooming, and locomotion level  PARTICIPATION LIMITATIONS: meal prep, cleaning, laundry, interpersonal relationship, shopping, community activity, occupation, and yard work  PERSONAL FACTORS: Age, Fitness, Time since onset of injury/illness/exacerbation, and 1-2 comorbidities: HTN; Scoliosis are also affecting patient's functional outcome.   REHAB POTENTIAL: Good  CLINICAL DECISION MAKING: Evolving/moderate complexity  EVALUATION COMPLEXITY: Moderate   GOALS: Goals reviewed with patient? Yes  SHORT TERM GOALS: Target date: 01/24/2024  Patient will be independent with initial HEP. Baseline:  Goal status: met 02/21/2024  2.  Patient will be able participate in the 3 or 6 MWT to establish a baseline for the test. Baseline:  Goal status: Met on 01/04/2024 (see above)  3.  Patient will demonstrate correct hinging technique to prevent mechanical strain and pain during lifting and bending activities. Baseline:  Goal status: Met on 04/16/24    LONG TERM GOALS: Target date: 06/08/2024  Patient will demonstrate independence in advanced HEP. Baseline:  Goal status: IN PROGRESS 02/21/2024  2.  Patient will report > or = to 30% improvement in functional mobility since starting PT. Baseline:  Goal status: met (60%) 02/21/2024  3.  Patient will verbalize and demonstrate self-care strategies to manage pain including tissue mobility practices and change of position. Baseline:  Goal status: IN PROGRESS 02/21/2024  4.  Patient will  score < or = to 12 sec on TUG to decrease falls risk. Baseline: 13.44 sec Goal status: MET 02/21/2024  5.  Patient will be able to walk for at least 6 mins to improve functional mobility and community ambulation. Baseline:  Goal status: Ongoing (see above)  5.  Patient will be able to stand for at least 5-6 mins for improved performance of self care and ADLS. Baseline:  Goal status: NEW   PLAN:  PT FREQUENCY: 1-2x/week  PT DURATION: 8 weeks  PLANNED INTERVENTIONS: 97164- PT Re-evaluation, 97110-Therapeutic exercises, 97530- Therapeutic activity, [id number]- Neuromuscular re-education, 97535- Self Care, 02859- Manual therapy, [id number]- Gait training, [phone]- Canalith repositioning, [id number]- Aquatic Therapy, [phone]- Electrical stimulation (unattended), [phone]- Electrical stimulation (manual), [id number]- Vasopneumatic device, [id number]- Ultrasound, [id number]- Traction (mechanical), [id number]- Ionotophoresis 4mg /ml Dexamethasone , 79439 (1-2 muscles), [id number] (3+ muscles)- Dry Needling, Patient/Family education, Balance training, Stair training, Taping, Joint mobilization, Joint manipulation, Spinal manipulation, Spinal mobilization, Scar mobilization, Compression bandaging, Vestibular training, Cryotherapy, and Moist heat.  PLAN FOR NEXT  SESSION: plan on 30 day hold    Kristeen Sar, PT, DPT 05/08/24 2:16 PM [facility] [address], [address] [place], [place] [address] Phone # [phone] Fax [phone]  "

## 2024-05-23 ENCOUNTER — Encounter: Payer: Self-pay | Admitting: Vascular Surgery

## 2024-05-23 ENCOUNTER — Ambulatory Visit: Admitting: Vascular Surgery

## 2024-05-23 ENCOUNTER — Ambulatory Visit (HOSPITAL_COMMUNITY)
Admission: RE | Admit: 2024-05-23 | Discharge: 2024-05-23 | Disposition: A | Source: Ambulatory Visit | Attending: Vascular Surgery

## 2024-05-23 VITALS — BP 161/86 | HR 58 | Temp 97.5°F

## 2024-05-23 DIAGNOSIS — K551 Chronic vascular disorders of intestine: Secondary | ICD-10-CM | POA: Diagnosis not present

## 2024-05-23 DIAGNOSIS — R1084 Generalized abdominal pain: Secondary | ICD-10-CM | POA: Diagnosis not present

## 2024-05-23 NOTE — Progress Notes (Signed)
 "  Patient ID: Morgan Fitzgerald, female   DOB: March 01, 1946, 79 y.o.   MRN: [id number]  Reason for Consult: New Patient (Initial Visit)   Referred by Emerick Avelina POUR, PA-C  Subjective:     HPI:  Morgan Fitzgerald is a 79 y.o. female with vascular history.  She does have hypertension and hyperlipidemia and is a longtime everyday smoker although only a couple cigarettes per day at this time.  She does not take blood thinners and does not take antiplatelet agents secondary to chronic kidney disease followed by nephrologist in [facility].  She is here today for concerns of SMA stenosis found on CT scan in October.  She has not had any weight loss she continues to eat without pain.  Past Medical History:  Diagnosis Date   Allergic state 01/14/2011   Bronchitis 03/24/2011   Chicken pox as a child   Hyperlipidemia    Hyperlipidemia 03/09/2012   Hypertension    Measles as a child   Preventative health care 01/14/2011   Tobacco abuse 03/24/2011   Family History  Problem Relation Age of Onset   Hypertension Mother    Hyperlipidemia Mother    Coronary artery disease Mother    Cancer Mother        breast   Stroke Father    Heart disease Father    Hyperlipidemia Father    Coronary artery disease Father        s/p MI   Diabetes Father    Hypertension Father    Hypertension Sister    Hyperlipidemia Sister    Obesity Sister    Coronary artery disease Maternal Grandmother    Heart attack Maternal Grandfather    Diabetes Paternal Grandmother    Hypertension Paternal Grandmother    Hyperlipidemia Paternal Grandmother    Obesity Paternal Grandmother    Stroke Paternal Grandfather    Past Surgical History:  Procedure Laterality Date   BREAST SURGERY  1997   breast biopsy, right   carpal tunnel release, b/l  1990's   right foot bunionectomy  2010   4 pins in lateral right foot   right shoulder repai  1966   s/p fracture that healed poorly   TONSILLECTOMY AND ADENOIDECTOMY     total knee  replacement, right     TUBAL LIGATION      Short Social History:  Social History   Tobacco Use   Smoking status: Every Day    Current packs/day: 0.25    Average packs/day: 0.3 packs/day for 60.0 years (15.0 ttl pk-yrs)    Types: Cigarettes    Start date: 05/23/1964   Smokeless tobacco: Never  Substance Use Topics   Alcohol use: Yes    Comment: occasional    Allergies[1]  Current Outpatient Medications  Medication Sig Dispense Refill   aspirin 81 MG tablet Take 81 mg by mouth at bedtime.      atorvastatin (LIPITOR) 10 MG tablet      Bioflavonoid Products (BIOFLEX) TABS Take by mouth.     Calcium Carbonate (CALCIUM 500 PO) Take 1 tablet by mouth daily.       Coenzyme Q10 (CO Q 10 PO) Take by mouth daily.     diclofenac  sodium (VOLTAREN ) 1 % GEL APPLY TOPICALLY AS NEEDED 100 g 0   etodolac (LODINE) 400 MG tablet etodolac 400 mg tablet  TAKE 1 TABLET BY MOUTH TWICE DAILY AS NEEDED     fish oil-omega-3 fatty acids 1000 MG capsule Take 2 g by mouth  2 (two) times daily.       Flaxseed, Linseed, (FLAX SEEDS PO) Take 1 capsule by mouth 3 (three) times daily.       fluticasone  (FLONASE ) 50 MCG/ACT nasal spray Place 1 spray into the nose daily as needed for rhinitis. 16 g 2   FOLIC ACID PO Take 1 tablet by mouth daily.       HYDROcodone-acetaminophen (NORCO/VICODIN) 5-325 MG tablet hydrocodone 5 mg-acetaminophen 325 mg tablet     niacin  (NIASPAN ) 500 MG CR tablet Take 4 tablets (2,000 mg total) by mouth at bedtime.     Plant Sterols and Stanols (CHOLEST OFF PO) Take by mouth.     triamterene -hydrochlorothiazide (MAXZIDE-25) 37.5-25 MG per tablet Take 1 each (1 tablet total) by mouth daily. 30 tablet 6   VITAMIN C, CALCIUM ASCORBATE, PO Take by mouth.     VITAMIN D, ERGOCALCIFEROL, PO Take 1 tablet by mouth daily.       zoster vaccine live, PF, (ZOSTAVAX) 80599 UNT/0.65ML injection Inject 19,400 Units into the skin once. 1 each 0   No current facility-administered medications for this  visit.    Review of Systems  Constitutional:  Constitutional negative. HENT: HENT negative.  Eyes: Eyes negative.  Respiratory: Respiratory negative.  GI: Gastrointestinal negative.  Musculoskeletal: Musculoskeletal negative.  Skin: Skin negative.  Neurological: Neurological negative. Hematologic: Hematologic/lymphatic negative.  Psychiatric: Psychiatric negative.        Objective:  Objective   Vitals:   05/23/24 1141  BP: (!) 161/86  Pulse: (!) 58  Temp: (!) 97.5 F (36.4 C)  SpO2: 94%   There is no height or weight on file to calculate BMI.  Physical Exam HENT:     Head: Normocephalic.     Mouth/Throat:     Mouth: Mucous membranes are moist.  Eyes:     Pupils: Pupils are equal, round, and reactive to light.  Neck:     Vascular: No carotid bruit.  Cardiovascular:     Rate and Rhythm: Normal rate.  Abdominal:     General: Abdomen is flat.     Palpations: Abdomen is soft.  Musculoskeletal:        General: Normal range of motion.     Cervical back: Neck supple.     Right lower leg: No edema.     Left lower leg: No edema.  Skin:    General: Skin is warm.  Neurological:     General: No focal deficit present.     Mental Status: She is alert.  Psychiatric:        Mood and Affect: Mood normal.     Data: Duplex Findings:  +----------------------+--------+--------+------+--------+  Mesenteric           PSV cm/sEDV cm/sPlaqueComments  +----------------------+--------+--------+------+--------+  Aorta Prox               87                           +----------------------+--------+--------+------+--------+  Aorta Mid               104                           +----------------------+--------+--------+------+--------+  Aorta Distal            116                           +----------------------+--------+--------+------+--------+  Celiac Artery Proximal  113                            +----------------------+--------+--------+------+--------+  SMA Origin              241                           +----------------------+--------+--------+------+--------+  SMA Proximal            815                           +----------------------+--------+--------+------+--------+  SMA Mid                 635                           +----------------------+--------+--------+------+--------+  SMA Distal              137                           +----------------------+--------+--------+------+--------+  CHA                    223                           +----------------------+--------+--------+------+--------+  Splenic                172                           +----------------------+--------+--------+------+--------+  IMA                    301                           +----------------------+--------+--------+------+--------+           Summary:  Mesenteric:  70 to 99% stenosis in the superior mesenteric artery. The Inferior  Mesenteric  artery appears stenotic.   CTA CHEST, ABDOMEN AND PELVIS WITH AND WITHOUT CONTRAST 01/31/2024 08:54:49 PM   TECHNIQUE: CTA of the chest was performed with and without the administration of intravenous contrast. CTA of the abdomen and pelvis was performed with and without the administration of intravenous contrast. Multiplanar reformatted images are provided for review. MIP images are provided for review. Automated exposure control, iterative reconstruction, and/or weight based adjustment of the mA/kV was utilized to reduce the radiation dose to as low as reasonably achievable. 75 mL of iohexol  (OMNIPAQUE ) 350 MG/ML injection was administered.   COMPARISON: None available.   CLINICAL HISTORY: Acute aortic syndrome (AAS) suspected.   FINDINGS:   VASCULATURE: AORTA: Moderate calcified and noncalcified atherosclerotic disease throughout the abdominal aorta. No abdominal aortic  aneurysm. No dissection.   PULMONARY ARTERIES: No pulmonary embolism with the limits of this exam.   GREAT VESSELS OF AORTIC ARCH: No acute finding. No dissection. No arterial occlusion or significant stenosis.   CELIAC TRUNK: No acute finding. No occlusion or significant stenosis.   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 The remaining superior mesenteric artery appears within normal limits.   INFERIOR MESENTERIC ARTERY: No acute finding. No occlusion or significant stenosis.   RENAL ARTERIES: No  acute finding. No occlusion or significant stenosis.   ILIAC ARTERIES: No acute finding. No occlusion or significant stenosis.   CHEST: MEDIASTINUM: No mediastinal lymphadenopathy. The heart and pericardium demonstrate no acute abnormality.   LUNGS AND PLEURA: There are atelectatic changes in the lingula and right lower lobe. No focal consolidation or pulmonary edema. No evidence of pleural effusion or pneumothorax.   THORACIC BONES AND SOFT TISSUES: There is an 8 mm hypodense left thyroid  nodule. No acute bone abnormality.   ABDOMEN AND PELVIS: LIVER: Hypodensities in the left lobe of the liver are too small to characterize, likely cysts or hemangiomas.   GALLBLADDER AND BILE DUCTS: Gallbladder is unremarkable. No biliary ductal dilatation.   SPLEEN: The spleen is unremarkable.   PANCREAS: The pancreas is unremarkable.   ADRENAL GLANDS: Bilateral adrenal glands demonstrate no acute abnormality.   KIDNEYS, URETERS AND BLADDER: There are numerous cysts in both kidneys. There is a mildly hyperdense rounded area in the left kidney measuring 14 mm (image 6/167). No stones in the kidneys or ureters. No hydronephrosis. No perinephric or periureteral stranding. Urinary bladder is unremarkable.   GI AND BOWEL: Stomach and duodenal sweep demonstrate no acute abnormality. Appendix is not visualized. There is no  bowel obstruction. No abnormal bowel wall thickening or distension.   REPRODUCTIVE: Reproductive organs are unremarkable.   PERITONEUM AND RETROPERITONEUM: No ascites or free air.   LYMPH NODES: No lymphadenopathy.   ABDOMINAL BONES AND SOFT TISSUES: Degenerative changes are seen throughout the spine. No acute soft tissue abnormality.   IMPRESSION: 1. 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 2. No evidence for aortic dissection or aneurysm. 3. Moderate atherosclerotic disease of the thoracic aorta. 4. Indeterminate 14 mm mildly hyperdense lesion in the left kidney. This may represent a complex cyst and can be further evaluated with ultrasound.     Assessment/Plan:     79 year old female with high-grade stenosis of her SMA by CT scan also now confirmed with duplex with very high velocity.  Thankfully she remains asymptomatic and as such needs no intervention at this time.  We discussed the need for smoking cessation she demonstrates good understanding but unlikely for this to occur.  We also discussed possibly taking an antiplatelet agent however she will discuss with her nephrologist.  I will have her follow-up in 1 year with mesenteric duplex.     Penne Lonni Colorado MD [facility]      [1] No Known Allergies  "
